# Patient Record
Sex: Female | Born: 1955
Health system: Southern US, Community
[De-identification: ages and names within clinical notes are randomized; demographics above are authoritative.]

## PROBLEM LIST (undated history)

## (undated) DIAGNOSIS — E785 Hyperlipidemia, unspecified: Secondary | ICD-10-CM

## (undated) DIAGNOSIS — I499 Cardiac arrhythmia, unspecified: Secondary | ICD-10-CM

## (undated) DIAGNOSIS — G43909 Migraine, unspecified, not intractable, without status migrainosus: Secondary | ICD-10-CM

## (undated) DIAGNOSIS — M25552 Pain in left hip: Secondary | ICD-10-CM

## (undated) DIAGNOSIS — R002 Palpitations: Secondary | ICD-10-CM

## (undated) DIAGNOSIS — I4891 Unspecified atrial fibrillation: Secondary | ICD-10-CM

## (undated) HISTORY — DX: Pain in left hip: M25.552

## (undated) HISTORY — DX: Migraine, unspecified, not intractable, without status migrainosus: G43.909

## (undated) HISTORY — DX: Palpitations: R00.2

---
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
## 1997-12-01 ENCOUNTER — Other Ambulatory Visit: Admission: RE | Admit: 1997-12-01 | Discharge: 1997-12-01 | Payer: Self-pay | Admitting: Obstetrics and Gynecology

## 1998-02-23 ENCOUNTER — Ambulatory Visit (HOSPITAL_COMMUNITY): Admission: RE | Admit: 1998-02-23 | Discharge: 1998-02-23 | Payer: Self-pay | Admitting: *Deleted

## 1999-04-14 ENCOUNTER — Other Ambulatory Visit: Admission: RE | Admit: 1999-04-14 | Discharge: 1999-04-14 | Payer: Self-pay | Admitting: Obstetrics and Gynecology

## 1999-04-25 ENCOUNTER — Ambulatory Visit (HOSPITAL_COMMUNITY): Admission: RE | Admit: 1999-04-25 | Discharge: 1999-04-25 | Payer: Self-pay | Admitting: Obstetrics and Gynecology

## 2000-03-04 ENCOUNTER — Ambulatory Visit (HOSPITAL_COMMUNITY): Admission: RE | Admit: 2000-03-04 | Discharge: 2000-03-04 | Payer: Self-pay | Admitting: Obstetrics and Gynecology

## 2000-03-19 HISTORY — PX: BACK SURGERY: SHX140

## 2000-11-08 ENCOUNTER — Ambulatory Visit (HOSPITAL_COMMUNITY): Admission: RE | Admit: 2000-11-08 | Discharge: 2000-11-08 | Payer: Self-pay | Admitting: Neurosurgery

## 2000-11-08 ENCOUNTER — Encounter: Payer: Self-pay | Admitting: Neurosurgery

## 2000-12-05 ENCOUNTER — Ambulatory Visit (HOSPITAL_COMMUNITY): Admission: RE | Admit: 2000-12-05 | Discharge: 2000-12-05 | Payer: Self-pay | Admitting: Neurosurgery

## 2000-12-05 ENCOUNTER — Encounter: Payer: Self-pay | Admitting: Neurosurgery

## 2002-08-27 ENCOUNTER — Other Ambulatory Visit: Admission: RE | Admit: 2002-08-27 | Discharge: 2002-08-27 | Payer: Self-pay | Admitting: Obstetrics and Gynecology

## 2003-11-02 ENCOUNTER — Other Ambulatory Visit: Admission: RE | Admit: 2003-11-02 | Discharge: 2003-11-02 | Payer: Self-pay | Admitting: Obstetrics and Gynecology

## 2004-11-02 ENCOUNTER — Ambulatory Visit: Payer: Self-pay | Admitting: Family Medicine

## 2004-11-06 ENCOUNTER — Other Ambulatory Visit: Admission: RE | Admit: 2004-11-06 | Discharge: 2004-11-06 | Payer: Self-pay | Admitting: Obstetrics and Gynecology

## 2005-03-19 HISTORY — PX: FOOT SURGERY: SHX648

## 2012-03-19 DIAGNOSIS — E785 Hyperlipidemia, unspecified: Secondary | ICD-10-CM | POA: Insufficient documentation

## 2012-03-19 HISTORY — DX: Hyperlipidemia, unspecified: E78.5

## 2016-03-01 DIAGNOSIS — J018 Other acute sinusitis: Secondary | ICD-10-CM | POA: Diagnosis not present

## 2016-05-16 DIAGNOSIS — Z8249 Family history of ischemic heart disease and other diseases of the circulatory system: Secondary | ICD-10-CM | POA: Diagnosis not present

## 2016-05-16 DIAGNOSIS — I208 Other forms of angina pectoris: Secondary | ICD-10-CM | POA: Diagnosis not present

## 2016-05-16 DIAGNOSIS — Z79899 Other long term (current) drug therapy: Secondary | ICD-10-CM | POA: Diagnosis not present

## 2016-05-16 DIAGNOSIS — Z7982 Long term (current) use of aspirin: Secondary | ICD-10-CM | POA: Diagnosis not present

## 2016-05-16 DIAGNOSIS — I499 Cardiac arrhythmia, unspecified: Secondary | ICD-10-CM | POA: Diagnosis not present

## 2016-05-16 DIAGNOSIS — I480 Paroxysmal atrial fibrillation: Secondary | ICD-10-CM | POA: Diagnosis not present

## 2016-05-16 DIAGNOSIS — I483 Typical atrial flutter: Secondary | ICD-10-CM | POA: Diagnosis not present

## 2016-05-16 DIAGNOSIS — R079 Chest pain, unspecified: Secondary | ICD-10-CM | POA: Diagnosis not present

## 2016-05-16 DIAGNOSIS — E785 Hyperlipidemia, unspecified: Secondary | ICD-10-CM | POA: Diagnosis not present

## 2016-05-16 DIAGNOSIS — I481 Persistent atrial fibrillation: Secondary | ICD-10-CM | POA: Diagnosis not present

## 2016-05-16 DIAGNOSIS — R001 Bradycardia, unspecified: Secondary | ICD-10-CM | POA: Diagnosis not present

## 2016-05-16 DIAGNOSIS — E870 Hyperosmolality and hypernatremia: Secondary | ICD-10-CM | POA: Diagnosis not present

## 2016-05-16 DIAGNOSIS — E784 Other hyperlipidemia: Secondary | ICD-10-CM | POA: Diagnosis not present

## 2016-05-16 DIAGNOSIS — Z880 Allergy status to penicillin: Secondary | ICD-10-CM | POA: Diagnosis not present

## 2016-05-16 DIAGNOSIS — R0602 Shortness of breath: Secondary | ICD-10-CM | POA: Diagnosis not present

## 2016-05-16 DIAGNOSIS — I4891 Unspecified atrial fibrillation: Secondary | ICD-10-CM | POA: Diagnosis not present

## 2016-05-16 DIAGNOSIS — Z0001 Encounter for general adult medical examination with abnormal findings: Secondary | ICD-10-CM | POA: Diagnosis not present

## 2016-05-16 DIAGNOSIS — I361 Nonrheumatic tricuspid (valve) insufficiency: Secondary | ICD-10-CM | POA: Diagnosis not present

## 2016-05-17 DIAGNOSIS — E870 Hyperosmolality and hypernatremia: Secondary | ICD-10-CM | POA: Diagnosis not present

## 2016-05-17 DIAGNOSIS — I480 Paroxysmal atrial fibrillation: Secondary | ICD-10-CM | POA: Diagnosis not present

## 2016-05-17 DIAGNOSIS — I4891 Unspecified atrial fibrillation: Secondary | ICD-10-CM

## 2016-05-17 HISTORY — DX: Unspecified atrial fibrillation: I48.91

## 2016-05-18 DIAGNOSIS — R079 Chest pain, unspecified: Secondary | ICD-10-CM | POA: Diagnosis not present

## 2016-05-18 DIAGNOSIS — Z0001 Encounter for general adult medical examination with abnormal findings: Secondary | ICD-10-CM | POA: Diagnosis not present

## 2016-05-18 DIAGNOSIS — I4891 Unspecified atrial fibrillation: Secondary | ICD-10-CM | POA: Diagnosis not present

## 2016-05-18 DIAGNOSIS — R001 Bradycardia, unspecified: Secondary | ICD-10-CM | POA: Diagnosis not present

## 2016-05-18 DIAGNOSIS — E870 Hyperosmolality and hypernatremia: Secondary | ICD-10-CM | POA: Diagnosis not present

## 2016-05-18 DIAGNOSIS — E784 Other hyperlipidemia: Secondary | ICD-10-CM | POA: Diagnosis not present

## 2016-05-18 DIAGNOSIS — I480 Paroxysmal atrial fibrillation: Secondary | ICD-10-CM | POA: Diagnosis not present

## 2016-05-22 DIAGNOSIS — R002 Palpitations: Secondary | ICD-10-CM | POA: Diagnosis not present

## 2016-05-22 DIAGNOSIS — R9431 Abnormal electrocardiogram [ECG] [EKG]: Secondary | ICD-10-CM | POA: Diagnosis not present

## 2016-05-22 DIAGNOSIS — Z8249 Family history of ischemic heart disease and other diseases of the circulatory system: Secondary | ICD-10-CM | POA: Diagnosis not present

## 2016-05-22 DIAGNOSIS — I4891 Unspecified atrial fibrillation: Secondary | ICD-10-CM | POA: Diagnosis not present

## 2016-05-28 DIAGNOSIS — R251 Tremor, unspecified: Secondary | ICD-10-CM | POA: Diagnosis not present

## 2016-05-28 DIAGNOSIS — R002 Palpitations: Secondary | ICD-10-CM | POA: Diagnosis not present

## 2016-05-28 DIAGNOSIS — I480 Paroxysmal atrial fibrillation: Secondary | ICD-10-CM | POA: Diagnosis not present

## 2016-05-28 DIAGNOSIS — R9431 Abnormal electrocardiogram [ECG] [EKG]: Secondary | ICD-10-CM | POA: Diagnosis not present

## 2016-05-28 DIAGNOSIS — R450 Nervousness: Secondary | ICD-10-CM | POA: Diagnosis not present

## 2016-05-28 DIAGNOSIS — Z8249 Family history of ischemic heart disease and other diseases of the circulatory system: Secondary | ICD-10-CM | POA: Diagnosis not present

## 2016-05-28 DIAGNOSIS — I4891 Unspecified atrial fibrillation: Secondary | ICD-10-CM | POA: Diagnosis not present

## 2016-07-03 DIAGNOSIS — Z23 Encounter for immunization: Secondary | ICD-10-CM | POA: Diagnosis not present

## 2016-07-04 DIAGNOSIS — Z23 Encounter for immunization: Secondary | ICD-10-CM | POA: Diagnosis not present

## 2016-07-11 DIAGNOSIS — I480 Paroxysmal atrial fibrillation: Secondary | ICD-10-CM | POA: Diagnosis not present

## 2016-07-19 DIAGNOSIS — I480 Paroxysmal atrial fibrillation: Secondary | ICD-10-CM | POA: Diagnosis not present

## 2016-07-19 DIAGNOSIS — Z79899 Other long term (current) drug therapy: Secondary | ICD-10-CM | POA: Diagnosis not present

## 2016-07-19 DIAGNOSIS — R002 Palpitations: Secondary | ICD-10-CM | POA: Diagnosis not present

## 2016-07-19 DIAGNOSIS — Z7982 Long term (current) use of aspirin: Secondary | ICD-10-CM | POA: Diagnosis not present

## 2016-07-19 DIAGNOSIS — I10 Essential (primary) hypertension: Secondary | ICD-10-CM | POA: Diagnosis not present

## 2016-07-19 DIAGNOSIS — R0602 Shortness of breath: Secondary | ICD-10-CM | POA: Diagnosis not present

## 2016-07-19 DIAGNOSIS — R000 Tachycardia, unspecified: Secondary | ICD-10-CM | POA: Diagnosis not present

## 2016-08-02 DIAGNOSIS — I480 Paroxysmal atrial fibrillation: Secondary | ICD-10-CM | POA: Diagnosis not present

## 2016-08-16 DIAGNOSIS — I480 Paroxysmal atrial fibrillation: Secondary | ICD-10-CM | POA: Diagnosis not present

## 2016-08-21 DIAGNOSIS — D485 Neoplasm of uncertain behavior of skin: Secondary | ICD-10-CM | POA: Diagnosis not present

## 2016-08-22 DIAGNOSIS — I480 Paroxysmal atrial fibrillation: Secondary | ICD-10-CM | POA: Diagnosis not present

## 2016-08-29 DIAGNOSIS — G43909 Migraine, unspecified, not intractable, without status migrainosus: Secondary | ICD-10-CM | POA: Insufficient documentation

## 2016-08-29 DIAGNOSIS — Z7901 Long term (current) use of anticoagulants: Secondary | ICD-10-CM

## 2016-08-29 DIAGNOSIS — I4891 Unspecified atrial fibrillation: Secondary | ICD-10-CM | POA: Insufficient documentation

## 2016-08-29 HISTORY — DX: Migraine, unspecified, not intractable, without status migrainosus: G43.909

## 2016-08-29 HISTORY — DX: Long term (current) use of anticoagulants: Z79.01

## 2016-09-04 DIAGNOSIS — Z7901 Long term (current) use of anticoagulants: Secondary | ICD-10-CM | POA: Diagnosis not present

## 2016-09-04 DIAGNOSIS — I471 Supraventricular tachycardia: Secondary | ICD-10-CM | POA: Diagnosis not present

## 2016-09-04 DIAGNOSIS — I480 Paroxysmal atrial fibrillation: Secondary | ICD-10-CM | POA: Diagnosis not present

## 2016-09-04 DIAGNOSIS — R001 Bradycardia, unspecified: Secondary | ICD-10-CM | POA: Insufficient documentation

## 2016-09-04 HISTORY — DX: Bradycardia, unspecified: R00.1

## 2016-10-04 DIAGNOSIS — Z6827 Body mass index (BMI) 27.0-27.9, adult: Secondary | ICD-10-CM | POA: Diagnosis not present

## 2016-10-04 DIAGNOSIS — Z0000 Encounter for general adult medical examination without abnormal findings: Secondary | ICD-10-CM | POA: Diagnosis not present

## 2016-10-05 DIAGNOSIS — E782 Mixed hyperlipidemia: Secondary | ICD-10-CM | POA: Diagnosis not present

## 2016-10-05 DIAGNOSIS — Z0000 Encounter for general adult medical examination without abnormal findings: Secondary | ICD-10-CM | POA: Diagnosis not present

## 2016-10-05 DIAGNOSIS — I480 Paroxysmal atrial fibrillation: Secondary | ICD-10-CM | POA: Diagnosis not present

## 2016-10-16 DIAGNOSIS — I6523 Occlusion and stenosis of bilateral carotid arteries: Secondary | ICD-10-CM | POA: Diagnosis not present

## 2016-10-16 DIAGNOSIS — Z136 Encounter for screening for cardiovascular disorders: Secondary | ICD-10-CM | POA: Diagnosis not present

## 2016-10-16 DIAGNOSIS — H8149 Vertigo of central origin, unspecified ear: Secondary | ICD-10-CM | POA: Diagnosis not present

## 2016-10-16 DIAGNOSIS — R42 Dizziness and giddiness: Secondary | ICD-10-CM | POA: Diagnosis not present

## 2016-10-16 DIAGNOSIS — Z8489 Family history of other specified conditions: Secondary | ICD-10-CM | POA: Diagnosis not present

## 2016-10-16 DIAGNOSIS — I482 Chronic atrial fibrillation: Secondary | ICD-10-CM | POA: Diagnosis not present

## 2016-10-16 DIAGNOSIS — I639 Cerebral infarction, unspecified: Secondary | ICD-10-CM | POA: Diagnosis not present

## 2016-10-16 DIAGNOSIS — I714 Abdominal aortic aneurysm, without rupture: Secondary | ICD-10-CM | POA: Diagnosis not present

## 2016-12-11 DIAGNOSIS — I471 Supraventricular tachycardia: Secondary | ICD-10-CM | POA: Diagnosis not present

## 2016-12-11 DIAGNOSIS — I480 Paroxysmal atrial fibrillation: Secondary | ICD-10-CM | POA: Diagnosis not present

## 2016-12-11 DIAGNOSIS — F419 Anxiety disorder, unspecified: Secondary | ICD-10-CM

## 2016-12-11 DIAGNOSIS — R001 Bradycardia, unspecified: Secondary | ICD-10-CM | POA: Diagnosis not present

## 2016-12-11 DIAGNOSIS — Z7901 Long term (current) use of anticoagulants: Secondary | ICD-10-CM | POA: Diagnosis not present

## 2016-12-11 HISTORY — DX: Anxiety disorder, unspecified: F41.9

## 2016-12-25 DIAGNOSIS — Z01419 Encounter for gynecological examination (general) (routine) without abnormal findings: Secondary | ICD-10-CM | POA: Diagnosis not present

## 2016-12-25 DIAGNOSIS — Z6827 Body mass index (BMI) 27.0-27.9, adult: Secondary | ICD-10-CM | POA: Diagnosis not present

## 2016-12-25 DIAGNOSIS — Z124 Encounter for screening for malignant neoplasm of cervix: Secondary | ICD-10-CM | POA: Diagnosis not present

## 2016-12-25 DIAGNOSIS — Z1231 Encounter for screening mammogram for malignant neoplasm of breast: Secondary | ICD-10-CM | POA: Diagnosis not present

## 2017-01-01 DIAGNOSIS — L989 Disorder of the skin and subcutaneous tissue, unspecified: Secondary | ICD-10-CM | POA: Diagnosis not present

## 2017-01-03 DIAGNOSIS — L989 Disorder of the skin and subcutaneous tissue, unspecified: Secondary | ICD-10-CM | POA: Diagnosis not present

## 2017-01-03 DIAGNOSIS — D225 Melanocytic nevi of trunk: Secondary | ICD-10-CM | POA: Diagnosis not present

## 2017-03-08 DIAGNOSIS — Z23 Encounter for immunization: Secondary | ICD-10-CM | POA: Diagnosis not present

## 2017-04-09 DIAGNOSIS — H5213 Myopia, bilateral: Secondary | ICD-10-CM | POA: Diagnosis not present

## 2017-07-19 ENCOUNTER — Ambulatory Visit: Payer: BLUE CROSS/BLUE SHIELD | Admitting: Sports Medicine

## 2017-07-19 ENCOUNTER — Encounter: Payer: Self-pay | Admitting: Sports Medicine

## 2017-07-19 DIAGNOSIS — L603 Nail dystrophy: Secondary | ICD-10-CM | POA: Diagnosis not present

## 2017-07-19 DIAGNOSIS — B351 Tinea unguium: Secondary | ICD-10-CM | POA: Diagnosis not present

## 2017-07-19 DIAGNOSIS — M67471 Ganglion, right ankle and foot: Secondary | ICD-10-CM

## 2017-07-19 DIAGNOSIS — L608 Other nail disorders: Secondary | ICD-10-CM | POA: Diagnosis not present

## 2017-07-19 NOTE — Progress Notes (Signed)
Subjective: Madeline Mcmillan is a 62 y.o. female patient seen today in office with complaint of mildly painful thickened and discolored first toenail.  Reports that she has given time with a dry blood to grow out and has not improved.  Patient denies any known trauma or injury to the toes states that she does notice that back in February is when she noticed the bleeding happening.  Does admit to a history of A. fib and was started on a full dose of aspirin and that is when she started having issues with her nails having blood underneath them and having thick debris underneath him. Reports that nails are becoming difficult to manage because of the thickness. Patient also is concerned about a cyst that she has on the side of her right foot states that she has had it aspirated before in the past several years ago and states that sometimes it becomes swollen and also painful and admits that the pain is a dull achy type of pain over the area and wants to discuss if anything needs to be done about this before getting x-rays at today's visit.  Patient has no other pedal complaints at this time.   Review of Systems  All other systems reviewed and are negative.  There are no active problems to display for this patient.   Current Outpatient Medications on File Prior to Visit  Medication Sig Dispense Refill  . aspirin 325 MG tablet Take 325 mg by mouth daily.    . calcium carbonate (OSCAL) 1500 (600 Ca) MG TABS tablet Take by mouth 2 (two) times daily with a meal.    . flecainide (TAMBOCOR) 100 MG tablet Take 100 mg by mouth 2 (two) times daily.    . Omega-3 Fatty Acids (FISH OIL) 1200 MG CAPS Take by mouth.     No current facility-administered medications on file prior to visit.     Allergies  Allergen Reactions  . Penicillins Rash    Objective: Physical Exam  General: Well developed, nourished, no acute distress, awake, alert and oriented x 3  Vascular: Dorsalis pedis artery 2/4 bilateral,  Posterior tibial artery 2/4 bilateral, skin temperature warm to warm proximal to distal bilateral lower extremities, no varicosities, pedal hair present bilateral.  Neurological: Gross sensation present via light touch bilateral.   Dermatological: Skin is warm, dry, and supple bilateral, bilateral hallux nails are thick, and discolored with mild subungal debris and dry heme, no webspace macerations present bilateral, no open lesions present bilateral, no callus/corns/hyperkeratotic tissue present bilateral.  There is a focal raised soft tissue mass noted at the lateral aspect of the right foot at the fifth metatarsal base that is freely movable likely consistent with ganglion cyst.  No signs of infection bilateral.  Musculoskeletal: Hallux extensus noted bilateral. Muscular strength within normal limits without painon range of motion. No pain with calf compression bilateral.  Assessment and Plan:  Problem List Items Addressed This Visit    None    Visit Diagnoses    Nail dystrophy    -  Primary   Nail hemorrhage       Ganglion cyst of right foot         -Examined patient -Discussed treatment options for painful dystrophic nails and for ganglion cyst on right foot -Patient would like to think about treatment options for cyst which I discussed today conservative care of offloading and padding, an office procedure of aspiration, or getting x-ray done followed by MRI for surgical planning of  complete excision.  Patient states that she would like to think about these options and let me know next visit what she prefers. -At great toenails fungal culture was obtained by removing a portion of the hard nail itself from each of the involved toenails using a sterile nail nipper and sent to [facility] lab. Patient tolerated the biopsy procedure well without discomfort or need for anesthesia.  -Patient to return in 4 weeks for follow up evaluation and discussion of fungal culture results or sooner if symptoms  worsen.  Asencion Islam, DPM

## 2017-08-05 ENCOUNTER — Telehealth: Payer: Self-pay | Admitting: Sports Medicine

## 2017-08-05 NOTE — Telephone Encounter (Signed)
Pt. Was calling to check on her results from her previous labs.

## 2017-08-06 NOTE — Telephone Encounter (Signed)
Will you let patient know that her fungal culture results were reviewed and show her nails are thickened and discolored due to microtrauma. There is NO fungus, yeast, or mold in nails. Changes to her nails are from pressure from shoes or possibly from stubbing the toe(s). She can try home treatments of filing her nails daily and applying tea tree oil to nails and then vinegar soaks (1 cup of white distilled vinegar to 8 cups of warm water) soak for 20 mins once weekly. Patient should also avoid shoes that crowd her toes. It will take time and consistency for 1 year to appreciate any difference with these home treatments. -Dr. Marylene Land

## 2017-08-06 NOTE — Telephone Encounter (Signed)
Left message requesting callback to discuss results and recommendations.

## 2017-08-06 NOTE — Telephone Encounter (Signed)
I informed pt of Dr. Stover's review of results and orders. Pt states understanding. 

## 2017-11-20 ENCOUNTER — Telehealth: Payer: Self-pay | Admitting: Sports Medicine

## 2017-11-20 NOTE — Telephone Encounter (Signed)
Pt states she has redness and irritation around the nails and something is not right. I told pt she had been using the tea tree oil for a long time and she may be having a reaction or in some case an infection, to stop the tea tree oil, and begin 1/4 cup epsom salt to 1 quart warm water daily until seen in office for reevaluation. Pt states she will call to schedule.

## 2017-11-20 NOTE — Telephone Encounter (Signed)
I saw Dr. Marylene Land in May and I have been doing some treatment with tee tree oil and foot soaking. I'm having some issues with that and I wanted to speak to the nurse to see what I might need to change or do differently. I can be reached at [phone]. Thank you.

## 2017-12-12 DIAGNOSIS — I471 Supraventricular tachycardia: Secondary | ICD-10-CM | POA: Diagnosis not present

## 2017-12-12 DIAGNOSIS — Z7901 Long term (current) use of anticoagulants: Secondary | ICD-10-CM | POA: Diagnosis not present

## 2017-12-12 DIAGNOSIS — R001 Bradycardia, unspecified: Secondary | ICD-10-CM | POA: Diagnosis not present

## 2017-12-12 DIAGNOSIS — R5383 Other fatigue: Secondary | ICD-10-CM | POA: Insufficient documentation

## 2017-12-12 DIAGNOSIS — I480 Paroxysmal atrial fibrillation: Secondary | ICD-10-CM | POA: Diagnosis not present

## 2017-12-12 HISTORY — DX: Other fatigue: R53.83

## 2017-12-25 DIAGNOSIS — R001 Bradycardia, unspecified: Secondary | ICD-10-CM | POA: Diagnosis not present

## 2018-01-02 DIAGNOSIS — Z1231 Encounter for screening mammogram for malignant neoplasm of breast: Secondary | ICD-10-CM | POA: Diagnosis not present

## 2018-01-02 DIAGNOSIS — Z01419 Encounter for gynecological examination (general) (routine) without abnormal findings: Secondary | ICD-10-CM | POA: Diagnosis not present

## 2018-01-02 DIAGNOSIS — Z124 Encounter for screening for malignant neoplasm of cervix: Secondary | ICD-10-CM | POA: Diagnosis not present

## 2018-01-03 DIAGNOSIS — Z1329 Encounter for screening for other suspected endocrine disorder: Secondary | ICD-10-CM | POA: Diagnosis not present

## 2018-01-03 DIAGNOSIS — Z13220 Encounter for screening for lipoid disorders: Secondary | ICD-10-CM | POA: Diagnosis not present

## 2018-01-03 DIAGNOSIS — Z1389 Encounter for screening for other disorder: Secondary | ICD-10-CM | POA: Diagnosis not present

## 2018-01-03 DIAGNOSIS — Z131 Encounter for screening for diabetes mellitus: Secondary | ICD-10-CM | POA: Diagnosis not present

## 2018-01-03 DIAGNOSIS — R5383 Other fatigue: Secondary | ICD-10-CM | POA: Diagnosis not present

## 2018-02-04 DIAGNOSIS — Z23 Encounter for immunization: Secondary | ICD-10-CM | POA: Diagnosis not present

## 2018-02-04 DIAGNOSIS — E782 Mixed hyperlipidemia: Secondary | ICD-10-CM | POA: Diagnosis not present

## 2018-02-04 DIAGNOSIS — I480 Paroxysmal atrial fibrillation: Secondary | ICD-10-CM | POA: Diagnosis not present

## 2018-05-06 DIAGNOSIS — E782 Mixed hyperlipidemia: Secondary | ICD-10-CM | POA: Diagnosis not present

## 2018-05-08 DIAGNOSIS — I480 Paroxysmal atrial fibrillation: Secondary | ICD-10-CM | POA: Diagnosis not present

## 2018-05-08 DIAGNOSIS — E782 Mixed hyperlipidemia: Secondary | ICD-10-CM | POA: Diagnosis not present

## 2018-05-21 DIAGNOSIS — H5213 Myopia, bilateral: Secondary | ICD-10-CM | POA: Diagnosis not present

## 2018-09-01 DIAGNOSIS — M79652 Pain in left thigh: Secondary | ICD-10-CM | POA: Diagnosis not present

## 2018-09-16 DIAGNOSIS — I480 Paroxysmal atrial fibrillation: Secondary | ICD-10-CM | POA: Diagnosis not present

## 2018-09-16 DIAGNOSIS — R001 Bradycardia, unspecified: Secondary | ICD-10-CM | POA: Diagnosis not present

## 2018-10-28 ENCOUNTER — Encounter: Payer: Self-pay | Admitting: Cardiology

## 2018-10-28 ENCOUNTER — Other Ambulatory Visit: Payer: Self-pay

## 2018-10-28 ENCOUNTER — Encounter (INDEPENDENT_AMBULATORY_CARE_PROVIDER_SITE_OTHER): Payer: Self-pay

## 2018-10-28 ENCOUNTER — Ambulatory Visit: Payer: BC Managed Care – PPO | Admitting: Cardiology

## 2018-10-28 VITALS — BP 120/80 | HR 64 | Ht 63.0 in | Wt 150.0 lb

## 2018-10-28 DIAGNOSIS — I480 Paroxysmal atrial fibrillation: Secondary | ICD-10-CM | POA: Diagnosis not present

## 2018-10-28 MED ORDER — DILTIAZEM HCL 30 MG PO TABS: 30.0000 mg | ORAL_TABLET | Freq: Four times a day (QID) | ORAL | 2 refills | Status: DC | PRN

## 2018-10-28 NOTE — Progress Notes (Signed)
Electrophysiology Office Note   Date:  10/28/2018   ID:  AYSEL GILCHREST, DOB 12/26/1955, MRN [id number]  PCP:  Rochel Brome, MD  Cardiologist:   Primary Electrophysiologist:  Sharrieff Spratlin Meredith Leeds, MD    No chief complaint on file.    History of Present Illness: Madeline Mcmillan is a 63 y.o. female who is being seen today for the evaluation of atrial fibrillation at the request of Cox, Kirsten, MD. Presenting today for electrophysiology evaluation.  She has a history of hyperlipidemia, and atrial fibrillation.  In February 2018, she was walking felt shaky and short of breath.  Her symptoms persisted and she went to a local hospital where she was diagnosed with atrial fibrillation with rapid ventricular response.  She was placed on diltiazem and spontaneously converted to sinus rhythm.  She underwent stress testing which showed normal perfusion as well as an echo with normal LV systolic function and normal biatrial size.  She was started on flecainide.  She had side effects from flecainide and thus it was discontinued.  She had a subsequent episode of atrial fibrillation May 2018 where she had palpitations.  Metoprolol was started at the time.  She had side effects her metoprolol which was discontinued.  Today, she denies symptoms of palpitations, chest pain, shortness of breath, orthopnea, PND, lower extremity edema, claudication, dizziness, presyncope, syncope, bleeding, or neurologic sequela. The patient is tolerating medications without difficulties.  Today, she is complaining of twinges in her chest.  The twinges last a few seconds at a time.  There are no exacerbating or alleviating factors.  Some days she has multiple episodes, some days she does not have any.  She is able to do all her daily activities without restriction.  She continues to exercise.   Past Medical History:  Diagnosis Date  . Atrial fibrillation ([place]) 05/2016  . Hyperlipidemia 2014  . Migraine headache   . Pain in  left hip    POSTERIOR AND LATERAL   . Palpitations    Past Surgical History:  Procedure Laterality Date  . BACK SURGERY  2002  . [place]  . FOOT SURGERY  2007  . TONSILLECTOMY  1965     Current Outpatient Medications  Medication Sig Dispense Refill  . aspirin EC 81 MG tablet Take 162 mg by mouth daily.    . calcium carbonate (OSCAL) 1500 (600 Ca) MG TABS tablet Take by mouth 2 (two) times daily with a meal.    . flecainide (TAMBOCOR) 100 MG tablet Take 100 mg by mouth daily as needed.     . Omega-3 Fatty Acids (FISH OIL) 1200 MG CAPS Take by mouth.    . diltiazem (CARDIZEM) 30 MG tablet Take 1 tablet (30 mg total) by mouth 4 (four) times daily as needed (for elevated heart rates). 30 tablet 2   No current facility-administered medications for this visit.     Allergies:   Amoxicillin and Penicillins   Social History:  The patient  reports that she has never smoked. She has never used smokeless tobacco. She reports that she does not drink alcohol or use drugs.   Family History:  The patient's family history includes CAD in her mother; Cerebral aneurysm in her brother; Healthy in her sister; Other in her father.    ROS:  Please see the history of present illness.   Otherwise, review of systems is positive for none.   All other systems are reviewed and negative.  PHYSICAL EXAM: VS:  BP 120/80   Pulse 64   Ht 5\' 3"  (1.6 m)   Wt 150 lb (68 kg)   BMI 26.57 kg/m  , BMI Body mass index is 26.57 kg/m. GEN: Well nourished, well developed, in no acute distress  HEENT: normal  Neck: no JVD, carotid bruits, or masses Cardiac: RRR; no murmurs, rubs, or gallops,no edema  Respiratory:  clear to auscultation bilaterally, normal work of breathing GI: soft, nontender, nondistended, + BS MS: no deformity or atrophy  Skin: warm and dry Neuro:  Strength and sensation are intact Psych: euthymic mood, full affect  EKG:  EKG is ordered today. Personal review of the  ekg ordered shows sinus rhythm, rate 64  Recent Labs: No results found for requested labs within last 8760 hours.    Lipid Panel  No results found for: CHOL, TRIG, HDL, CHOLHDL, VLDL, LDLCALC, LDLDIRECT   Wt Readings from Last 3 Encounters:  10/28/18 150 lb (68 kg)      Other studies Reviewed: Additional studies/ records that were reviewed today include: ETT 12/25/17  Review of the above records today demonstrates:  BASELINE EKG NORMAL AT 74/MIN.  PT EXERCISED 7 MIN, BRUCE AT 8 METS, ABOVE AVERAGE FUNCTIONAL CAPACITY FOR AGE. ACHIEVED HR 136/MIN 86% MPHR. NO ANGINA OR ST ABNORMALITY RARE PAC/PVC.   ASSESSMENT AND PLAN:  1.  Paroxysmal atrial fibrillation: Has been diagnosed with atrial fibrillation in [place].  She says that she has had 2 episodes requiring hospitalization, but she is converted to sinus rhythm on her own.  We Lyrical Sowle work to get the records from [facility].  Fortunately she is in sinus rhythm.  She takes as needed flecainide, but has not needed it very recently at all.  I Ronica Vivian give her a prescription for diltiazem as needed to take with her flecainide.  She is having some twinges that are short-lived.  I do not think that these are cardiac in nature.  I Jerrion Tabbert see her back in 1 year for follow-up.  This patients CHA2DS2-VASc Score and unadjusted Ischemic Stroke Rate (% per year) is equal to 0.6 % stroke rate/year from a score of 1  Above score calculated as 1 point each if present [CHF, HTN, DM, Vascular=MI/PAD/Aortic Plaque, Age if 65-74, or Female] Above score calculated as 2 points each if present [Age > 75, or Stroke/TIA/TE]   Current medicines are reviewed at length with the patient today.   The patient does not have concerns regarding her medicines.  The following changes were made today: Diltiazem 30 mg as needed  Labs/ tests ordered today include:  Orders Placed This Encounter  Procedures  . EKG 12-Lead     Disposition:   FU with Allice Garro 1 year  Signed, Beauford Lando Jorja LoaMartin Sumaiyah Markert, MD  10/28/2018 10:34 AM     [facility] [address] [place] [address] [phone] (office) [phone] (fax)

## 2018-10-28 NOTE — Patient Instructions (Addendum)
Medication Instructions:  Your physician has recommended you make the following change in your medication:  1. TAKE Diltiazem 30 mg every 6 hours as needed for elevated heart rates/atrial fibrillation.  * If you need a refill on your cardiac medications before your next appointment, please call your pharmacy.   Labwork: None ordered  Testing/Procedures: None ordered  Follow-Up: Your physician wants you to follow-up in: 1 year with Dr. Curt Bears in [place].  You will receive a reminder letter in the mail two months in advance. If you don't receive a letter, please call our office to schedule the follow-up appointment.  Thank you for choosing [facility]!!   Trinidad Curet, RN [phone]

## 2018-12-18 DIAGNOSIS — E782 Mixed hyperlipidemia: Secondary | ICD-10-CM | POA: Diagnosis not present

## 2018-12-18 DIAGNOSIS — M25552 Pain in left hip: Secondary | ICD-10-CM | POA: Diagnosis not present

## 2018-12-18 DIAGNOSIS — I480 Paroxysmal atrial fibrillation: Secondary | ICD-10-CM | POA: Diagnosis not present

## 2018-12-19 DIAGNOSIS — M25552 Pain in left hip: Secondary | ICD-10-CM | POA: Diagnosis not present

## 2019-01-14 DIAGNOSIS — M5416 Radiculopathy, lumbar region: Secondary | ICD-10-CM

## 2019-01-14 HISTORY — DX: Radiculopathy, lumbar region: M54.16

## 2019-01-20 DIAGNOSIS — Z01419 Encounter for gynecological examination (general) (routine) without abnormal findings: Secondary | ICD-10-CM | POA: Diagnosis not present

## 2019-01-20 DIAGNOSIS — Z124 Encounter for screening for malignant neoplasm of cervix: Secondary | ICD-10-CM | POA: Diagnosis not present

## 2019-01-20 DIAGNOSIS — Z6825 Body mass index (BMI) 25.0-25.9, adult: Secondary | ICD-10-CM | POA: Diagnosis not present

## 2019-01-20 DIAGNOSIS — Z1231 Encounter for screening mammogram for malignant neoplasm of breast: Secondary | ICD-10-CM | POA: Diagnosis not present

## 2019-01-21 ENCOUNTER — Other Ambulatory Visit: Payer: Self-pay | Admitting: Neurosurgery

## 2019-01-21 DIAGNOSIS — M5416 Radiculopathy, lumbar region: Secondary | ICD-10-CM

## 2019-01-23 ENCOUNTER — Telehealth: Payer: Self-pay | Admitting: Cardiology

## 2019-01-23 NOTE — Telephone Encounter (Signed)
° °*  STAT* If patient is at the pharmacy, call can be transferred to refill team.   1. Which medications need to be refilled? (please list name of each medication and dose if known)  flecainide (TAMBOCOR) 100 MG tablet  2. Which pharmacy/location (including street and city if local pharmacy) is medication to be sent to? [place], [place]  3. Do they need a 30 day or 90 day supply? 66  Patient had an old rx from her previous doctor that has run out. She would like Dr. Curt Bears to write a new rx for her.

## 2019-01-26 ENCOUNTER — Other Ambulatory Visit: Payer: Self-pay

## 2019-01-26 NOTE — Telephone Encounter (Signed)
Ok to refill 

## 2019-01-26 NOTE — Telephone Encounter (Signed)
Ok to refill for 1 year.  Patient is due for follow up next August, so make sure pt has enough to cover till September 2021.  Thanks

## 2019-01-27 MED ORDER — FLECAINIDE ACETATE 100 MG PO TABS: 100.0000 mg | ORAL_TABLET | Freq: Every day | ORAL | 2 refills | Status: DC | PRN

## 2019-01-27 NOTE — Telephone Encounter (Signed)
Outpatient Medication Detail   Disp Refills Start End   flecainide (TAMBOCOR) 100 MG tablet 90 tablet 2 01/27/2019    Sig - Route: Take 1 tablet (100 mg total) by mouth daily as needed. - Oral   Sent to pharmacy as: flecainide (TAMBOCOR) 100 MG tablet   E-Prescribing Status: Sent to pharmacy (01/27/2019 9:56 AM EST)   Pharmacy  [place], [place]

## 2019-01-29 MED ORDER — FLECAINIDE ACETATE 100 MG PO TABS: ORAL_TABLET | ORAL | 2 refills | Status: DC

## 2019-02-16 ENCOUNTER — Other Ambulatory Visit: Payer: BC Managed Care – PPO

## 2019-02-19 DIAGNOSIS — Z1211 Encounter for screening for malignant neoplasm of colon: Secondary | ICD-10-CM | POA: Insufficient documentation

## 2019-02-19 DIAGNOSIS — K648 Other hemorrhoids: Secondary | ICD-10-CM | POA: Diagnosis not present

## 2019-02-19 HISTORY — DX: Encounter for screening for malignant neoplasm of colon: Z12.11

## 2019-02-27 ENCOUNTER — Ambulatory Visit
Admission: RE | Admit: 2019-02-27 | Discharge: 2019-02-27 | Disposition: A | Discharge status: Home / Self Care | Payer: BC Managed Care – PPO | Source: Ambulatory Visit | Attending: Neurosurgery | Admitting: Neurosurgery

## 2019-02-27 ENCOUNTER — Other Ambulatory Visit: Payer: Self-pay

## 2019-02-27 DIAGNOSIS — M5416 Radiculopathy, lumbar region: Secondary | ICD-10-CM

## 2019-02-27 DIAGNOSIS — M5127 Other intervertebral disc displacement, lumbosacral region: Secondary | ICD-10-CM | POA: Diagnosis not present

## 2019-02-27 MED ORDER — GADOBENATE DIMEGLUMINE 529 MG/ML IV SOLN
13.0000 mL | Freq: Once | INTRAVENOUS | Status: AC | PRN
  Administered 2019-02-27: 13 mL via INTRAVENOUS

## 2019-02-28 DIAGNOSIS — L719 Rosacea, unspecified: Secondary | ICD-10-CM | POA: Diagnosis not present

## 2019-02-28 DIAGNOSIS — L570 Actinic keratosis: Secondary | ICD-10-CM | POA: Diagnosis not present

## 2019-02-28 DIAGNOSIS — D485 Neoplasm of uncertain behavior of skin: Secondary | ICD-10-CM | POA: Diagnosis not present

## 2019-03-03 DIAGNOSIS — M5416 Radiculopathy, lumbar region: Secondary | ICD-10-CM | POA: Diagnosis not present

## 2019-03-03 DIAGNOSIS — Z6825 Body mass index (BMI) 25.0-25.9, adult: Secondary | ICD-10-CM | POA: Diagnosis not present

## 2019-03-03 DIAGNOSIS — M4310 Spondylolisthesis, site unspecified: Secondary | ICD-10-CM

## 2019-03-03 HISTORY — DX: Spondylolisthesis, site unspecified: M43.10

## 2019-03-11 DIAGNOSIS — M4310 Spondylolisthesis, site unspecified: Secondary | ICD-10-CM | POA: Diagnosis not present

## 2019-03-11 DIAGNOSIS — M545 Low back pain: Secondary | ICD-10-CM | POA: Diagnosis not present

## 2019-03-24 DIAGNOSIS — M48061 Spinal stenosis, lumbar region without neurogenic claudication: Secondary | ICD-10-CM

## 2019-03-24 DIAGNOSIS — M48062 Spinal stenosis, lumbar region with neurogenic claudication: Secondary | ICD-10-CM | POA: Diagnosis not present

## 2019-03-24 DIAGNOSIS — M5416 Radiculopathy, lumbar region: Secondary | ICD-10-CM | POA: Diagnosis not present

## 2019-03-24 DIAGNOSIS — Z6825 Body mass index (BMI) 25.0-25.9, adult: Secondary | ICD-10-CM | POA: Diagnosis not present

## 2019-03-24 HISTORY — DX: Spinal stenosis, lumbar region without neurogenic claudication: M48.061

## 2019-04-03 DIAGNOSIS — M5416 Radiculopathy, lumbar region: Secondary | ICD-10-CM | POA: Diagnosis not present

## 2019-04-03 DIAGNOSIS — M48062 Spinal stenosis, lumbar region with neurogenic claudication: Secondary | ICD-10-CM | POA: Diagnosis not present

## 2019-04-23 DIAGNOSIS — M4310 Spondylolisthesis, site unspecified: Secondary | ICD-10-CM | POA: Diagnosis not present

## 2019-04-23 DIAGNOSIS — Z6826 Body mass index (BMI) 26.0-26.9, adult: Secondary | ICD-10-CM | POA: Diagnosis not present

## 2019-04-28 DIAGNOSIS — Z8639 Personal history of other endocrine, nutritional and metabolic disease: Secondary | ICD-10-CM | POA: Diagnosis not present

## 2019-04-28 DIAGNOSIS — Z8679 Personal history of other diseases of the circulatory system: Secondary | ICD-10-CM | POA: Diagnosis not present

## 2019-04-28 DIAGNOSIS — Z0000 Encounter for general adult medical examination without abnormal findings: Secondary | ICD-10-CM | POA: Diagnosis not present

## 2019-04-28 DIAGNOSIS — Z131 Encounter for screening for diabetes mellitus: Secondary | ICD-10-CM | POA: Diagnosis not present

## 2019-04-28 DIAGNOSIS — E663 Overweight: Secondary | ICD-10-CM | POA: Diagnosis not present

## 2019-04-28 DIAGNOSIS — R0989 Other specified symptoms and signs involving the circulatory and respiratory systems: Secondary | ICD-10-CM | POA: Diagnosis not present

## 2019-04-28 DIAGNOSIS — Z136 Encounter for screening for cardiovascular disorders: Secondary | ICD-10-CM | POA: Diagnosis not present

## 2019-04-28 DIAGNOSIS — Z13220 Encounter for screening for lipoid disorders: Secondary | ICD-10-CM | POA: Diagnosis not present

## 2019-04-28 DIAGNOSIS — Z1331 Encounter for screening for depression: Secondary | ICD-10-CM | POA: Diagnosis not present

## 2019-04-29 ENCOUNTER — Telehealth: Payer: Self-pay | Admitting: *Deleted

## 2019-04-29 NOTE — Telephone Encounter (Signed)
   Primary Cardiologist: Will Jorja Loa, MD  Chart reviewed as part of pre-operative protocol coverage. Patient last seen by Dr. Elberta Fortis on 10/28/2018 at which time she was doing well from a cardiac standpoint. She did report occasional "twinges" in her chest that would last for a few seconds and then resolve independently. This was felt to be non-cardiac in nature. Patient contacted today in regards to peri-operative risk assessment and reported she has done well since last visit. She continues to have occasional "twinges" on the left side of her chest that occur randomly and only last for a few seconds a time. Non-exertional and not worsening or becoming more frequent. She has occasional brief episodes of palpitations but has only needed her PRN Flecainide and Diltiazem once since her last visit about 3-4 months ago. No shortness of breath, lightheadedness, dizziness, syncope, orthopnea, PND, or edema. She walks about 3 miles everyday without any anginal symptoms.   Given past medical history and time since last visit, based on ACC/AHA guidelines, Madeline Mcmillan would be at acceptable risk for the planned procedure without further cardiovascular testing.   I will route this recommendation to the requesting party via Epic fax function and remove from pre-op pool.  Please call with questions.  Corrin Parker, PA-C 04/29/2019, 4:33 PM

## 2019-04-29 NOTE — Telephone Encounter (Signed)
   [facility] Pre-operative Risk Assessment    Request for surgical clearance:  1. What type of surgery is being performed? L4-5 LUMBAR FUSION   2. When is this surgery scheduled? 05/11/19   3. What type of clearance is required (medical clearance vs. Pharmacy clearance to hold med vs. Both)? MEDICAL  4. Are there any medications that need to be held prior to surgery and how long? NONE LISTED  5. Practice name and name of physician performing surgery? [facility]; DR. Mallie Mussel POOL   6. What is your office phone number [phone]    7.   What is your office fax number [phone] ATTN: [id number]  8.   Anesthesia type (None, local, MAC, general) ? GENERAL   Julaine Hua 04/29/2019, 3:40 PM  _________________________________________________________________   (provider comments below)

## 2019-05-05 DIAGNOSIS — M4316 Spondylolisthesis, lumbar region: Secondary | ICD-10-CM | POA: Diagnosis not present

## 2019-05-05 DIAGNOSIS — Z01818 Encounter for other preprocedural examination: Secondary | ICD-10-CM | POA: Diagnosis not present

## 2019-05-14 ENCOUNTER — Emergency Department (HOSPITAL_COMMUNITY): Payer: BLUE CROSS/BLUE SHIELD

## 2019-05-14 ENCOUNTER — Other Ambulatory Visit: Payer: Self-pay | Admitting: Adult Health

## 2019-05-14 ENCOUNTER — Emergency Department (HOSPITAL_COMMUNITY)
Admission: EM | Admit: 2019-05-14 | Discharge: 2019-05-14 | Disposition: A | Discharge status: Home / Self Care | Payer: BLUE CROSS/BLUE SHIELD | Attending: Emergency Medicine | Admitting: Emergency Medicine

## 2019-05-14 ENCOUNTER — Other Ambulatory Visit: Payer: Self-pay

## 2019-05-14 DIAGNOSIS — R0602 Shortness of breath: Secondary | ICD-10-CM | POA: Diagnosis not present

## 2019-05-14 DIAGNOSIS — Z79899 Other long term (current) drug therapy: Secondary | ICD-10-CM | POA: Insufficient documentation

## 2019-05-14 DIAGNOSIS — J1282 Pneumonia due to coronavirus disease 2019: Secondary | ICD-10-CM | POA: Diagnosis not present

## 2019-05-14 DIAGNOSIS — U071 COVID-19: Secondary | ICD-10-CM | POA: Insufficient documentation

## 2019-05-14 DIAGNOSIS — Z6825 Body mass index (BMI) 25.0-25.9, adult: Secondary | ICD-10-CM | POA: Diagnosis not present

## 2019-05-14 DIAGNOSIS — J1289 Other viral pneumonia: Secondary | ICD-10-CM | POA: Diagnosis not present

## 2019-05-14 DIAGNOSIS — R509 Fever, unspecified: Secondary | ICD-10-CM | POA: Diagnosis not present

## 2019-05-14 DIAGNOSIS — R05 Cough: Secondary | ICD-10-CM | POA: Diagnosis not present

## 2019-05-14 LAB — BASIC METABOLIC PANEL
Anion gap: 10 (ref 5–15)
BUN: 12 mg/dL (ref 8–23)
CO2: 24 mmol/L (ref 22–32)
Calcium: 9 mg/dL (ref 8.9–10.3)
Chloride: 103 mmol/L (ref 98–111)
Creatinine, Ser: 0.66 mg/dL (ref 0.44–1.00)
GFR calc Af Amer: >60 mL/min (ref >60)
GFR calc non Af Amer: >60 mL/min (ref >60)
Glucose, Bld: 124 mg/dL — ABNORMAL HIGH (ref 70–99)
Potassium: 3.4 mmol/L — ABNORMAL LOW (ref 3.5–5.1)
Sodium: 137 mmol/L (ref 135–145)

## 2019-05-14 LAB — CBC WITH DIFFERENTIAL/PLATELET
Abs Immature Granulocytes: 0.04 10*3/uL (ref 0.00–0.07)
Basophils Absolute: 0 10*3/uL (ref 0.0–0.1)
Basophils Relative: 0 %
Eosinophils Absolute: 0 10*3/uL (ref 0.0–0.5)
Eosinophils Relative: 0 %
HCT: 42.5 % (ref 36.0–46.0)
Hemoglobin: 13.9 g/dL (ref 12.0–15.0)
Immature Granulocytes: 1 %
Lymphocytes Relative: 12 %
Lymphs Abs: 0.8 10*3/uL (ref 0.7–4.0)
MCH: 30 pg (ref 26.0–34.0)
MCHC: 32.7 g/dL (ref 30.0–36.0)
MCV: 91.8 fL (ref 80.0–100.0)
Monocytes Absolute: 0.6 10*3/uL (ref 0.1–1.0)
Monocytes Relative: 10 %
Neutro Abs: 5 10*3/uL (ref 1.7–7.7)
Neutrophils Relative %: 77 %
Platelets: 119 10*3/uL — ABNORMAL LOW (ref 150–400)
RBC: 4.63 MIL/uL (ref 3.87–5.11)
RDW: 12.8 % (ref 11.5–15.5)
WBC: 6.5 10*3/uL (ref 4.0–10.5)
nRBC: 0 % (ref 0.0–0.2)

## 2019-05-14 LAB — SARS CORONAVIRUS 2 (TAT 6-24 HRS): SARS Coronavirus 2: POSITIVE — AB

## 2019-05-14 MED ORDER — ALBUTEROL SULFATE HFA 108 (90 BASE) MCG/ACT IN AERS
4.0000 | INHALATION_SPRAY | Freq: Once | RESPIRATORY_TRACT | Status: AC
  Administered 2019-05-14: 15:00:00 4 via RESPIRATORY_TRACT
  Filled 2019-05-14: qty 6.7

## 2019-05-14 MED ORDER — ACETAMINOPHEN 500 MG PO TABS
1000.0000 mg | ORAL_TABLET | Freq: Once | ORAL | Status: AC
  Administered 2019-05-14: 1000 mg via ORAL
  Filled 2019-05-14: qty 2

## 2019-05-14 MED ORDER — PREDNISONE 20 MG PO TABS
60.0000 mg | ORAL_TABLET | Freq: Once | ORAL | Status: AC
  Administered 2019-05-14: 15:00:00 60 mg via ORAL
  Filled 2019-05-14: qty 3

## 2019-05-14 NOTE — ED Triage Notes (Addendum)
Per patient, she tested positive for covid 10 days ago, was feeling better, but last two days has had fever, went to pcp, pcp said to come to ED bec patient has pneumonia in both lungs. Patient denies pain, says she has shobr while ambulating. Patient o2 in triage 96 on RA, patient o2 while ambulating is 93% on RA

## 2019-05-14 NOTE — Progress Notes (Signed)
  I connected by phone with Madeline Mcmillan on 05/14/2019 at 6:27 PM to discuss the potential use of an new treatment for mild to moderate COVID-19 viral infection in non-hospitalized patients.  This patient is a 64 y.o. female that meets the FDA criteria for Emergency Use Authorization of bamlanivimab or casirivimab\imdevimab.  Has a (+) direct SARS-CoV-2 viral test result  Has mild or moderate COVID-19   Is ? 64 years of age and weighs ? 40 kg  Is NOT hospitalized due to COVID-19  Is NOT requiring oxygen therapy or requiring an increase in baseline oxygen flow rate due to COVID-19  Is within 10 days of symptom onset  Has at least one of the high risk factor(s) for progression to severe COVID-19 and/or hospitalization as defined in EUA.  Specific high risk criteria : Cardiovascular Disease   I have spoken and communicated the following to the patient or parent/caregiver:  1. FDA has authorized the emergency use of bamlanivimab and casirivimab\imdevimab for the treatment of mild to moderate COVID-19 in adults and pediatric patients with positive results of direct SARS-CoV-2 viral testing who are 103 years of age and older weighing at least 40 kg, and who are at high risk for progressing to severe COVID-19 and/or hospitalization.  2. The significant known and potential risks and benefits of bamlanivimab and casirivimab\imdevimab, and the extent to which such potential risks and benefits are unknown.  3. Information on available alternative treatments and the risks and benefits of those alternatives, including clinical trials.  4. Patients treated with bamlanivimab and casirivimab\imdevimab should continue to self-isolate and use infection control measures (e.g., wear mask, isolate, social distance, avoid sharing personal items, clean and disinfect "high touch" surfaces, and frequent handwashing) according to CDC guidelines.   5. The patient or parent/caregiver has the option to accept or  refuse bamlanivimab or casirivimab\imdevimab .  After reviewing this information with the patient, The patient agreed to proceed with receiving the bamlanimivab infusion and will be provided a copy of the Fact sheet prior to receiving the infusion.  Scheduled 05/15/19 at 1030 , will bring copy of test results.  Madeline Mcmillan Madeline Mcmillan 05/14/2019 6:27 PM

## 2019-05-14 NOTE — Discharge Instructions (Signed)
You have been called in the Covid hospital at home program, and they will reach out to you by phone  Use all the medications prescribed to you by your primary care doctor.  I recommend that you purchase a home pulse ox to help better monitor your oxygen at home, if you start to have increased work of breathing or shortness of breath or your oxygen drops below 90% please immediately return to the hospital for reevaluation.  If you develop persistent fevers, shortness of breath or difficulty breathing, chest pain, severe headache and neck pain, persistent nausea and vomiting or other new or concerning symptoms return to the Emergency department.

## 2019-05-14 NOTE — ED Provider Notes (Signed)
[facility] DEPT Provider Note   CSN: [id number] Arrival date & time: 05/14/19  1324     History Chief Complaint  Patient presents with  . covid pneumonia    Madeline Mcmillan is a 64 y.o. female.  Madeline Mcmillan is a 64 y.o. female with a history of hyperlipidemia, Afib and migraines, who presents with cough fever and shortness of breath.  Patient states that she tested positive for Covid 10 days ago during a preop testing appointment, but was asymptomatic at this time.  She states that she had no symptoms until yesterday when she started developing fever, chills, body aches, and cough.  She has also developed worsening shortness of breath over the past 2 days.  She went to see her PCP today and had a chest x-ray done that showed bilateral pneumonia, and was initially prescribed prednisone, albuterol and Levaquin, but her PCP then called her back and recommended that she come to the ED to get checked out.  She denies any associated chest pain, no nausea, vomiting or diarrhea.  States she is not used any of the medications prescribed by her PCP yet.  No other aggravating or alleviating factors.        Past Medical History:  Diagnosis Date  . Atrial fibrillation ([place]) 05/2016  . Hyperlipidemia 2014  . Migraine headache   . Pain in left hip    POSTERIOR AND LATERAL   . Palpitations     There are no problems to display for this patient.   Past Surgical History:  Procedure Laterality Date  . BACK SURGERY  2002  . [place]  . FOOT SURGERY  2007  . TONSILLECTOMY  1965     OB History   No obstetric history on file.     Family History  Problem Relation Age of Onset  . CAD Mother   . Other Father        BACK SURGERY, HIP REPLACEMENT  . Healthy Sister   . Cerebral aneurysm Brother     Social History   Tobacco Use  . Smoking status: Never Smoker  . Smokeless tobacco: Never Used  Substance Use Topics  . Alcohol  use: Never  . Drug use: Never    Home Medications Prior to Admission medications   Medication Sig Start Date End Date Taking? Authorizing Provider  atorvastatin (LIPITOR) 10 MG tablet Take 10 mg by mouth daily. 03/18/19  Yes [provider]  calcium carbonate (OSCAL) 1500 (600 Ca) MG TABS tablet Take by mouth 2 (two) times daily with a meal.   Yes [provider]  diltiazem (CARDIZEM) 30 MG tablet Take 1 tablet (30 mg total) by mouth 4 (four) times daily as needed (for elevated heart rates). 10/28/18  Yes Camnitz, Ocie Doyne, MD  flecainide [facility]) 100 MG tablet May take up to 300 mg of Flecainide daily as needed for Atrial fibrillation/palpitations. Patient taking differently: Take 300 mg by mouth as needed (r Atrial fibrillation/palpitations.). May take up to 300 mg of Flecainide daily as needed for Atrial fibrillation/palpitations. 01/29/19  Yes Camnitz, Will Hassell Done, MD  Omega-3 Fatty Acids (FISH OIL) 1200 MG CAPS Take 1,200 mg by mouth daily.    Yes [provider]  albuterol (VENTOLIN HFA) 108 (90 Base) MCG/ACT inhaler Inhale 1 puff into the lungs every 6 (six) hours as needed. 05/14/19   [provider]  levofloxacin (LEVAQUIN) 750 MG tablet Take 750 mg by mouth daily. 05/14/19  [provider]  predniSONE (DELTASONE) 20 MG tablet Take 20 mg by mouth 2 (two) times daily. 05/14/19   [provider]  VIRTUSSIN A/C 100-10 MG/5ML syrup Take 10 mLs by mouth every 8 (eight) hours as needed. 05/11/19   [provider]    Allergies    Amoxicillin and Penicillins  Review of Systems   Review of Systems  Constitutional: Positive for chills and fever.  HENT: Negative for congestion, rhinorrhea and sore throat.   Respiratory: Positive for cough and shortness of breath.   Cardiovascular: Negative for chest pain.  Gastrointestinal: Negative for abdominal pain, diarrhea, nausea and vomiting.  Genitourinary: Negative for dysuria and  frequency.  Musculoskeletal: Positive for myalgias. Negative for arthralgias and back pain.  Skin: Negative for color change and rash.  Neurological: Negative for dizziness, syncope and light-headedness.    Physical Exam Updated Vital Signs BP 115/77 (BP Location: Left Arm)   Pulse (!) 101   Temp 99.8 F (37.7 C) (Oral)   Resp 18   Ht 5\' 3"  (1.6 m)   Wt 65.8 kg   SpO2 96%   BMI 25.69 kg/m   Physical Exam Vitals and nursing note reviewed.  Constitutional:      General: She is not in acute distress.    Appearance: Normal appearance. She is well-developed. She is not ill-appearing or diaphoretic.  HENT:     Head: Normocephalic and atraumatic.     Mouth/Throat:     Mouth: Mucous membranes are moist.     Pharynx: Oropharynx is clear.  Eyes:     General:        Right eye: No discharge.        Left eye: No discharge.  Cardiovascular:     Rate and Rhythm: Normal rate and regular rhythm.     Heart sounds: Normal heart sounds.  Pulmonary:     Effort: Pulmonary effort is normal. No respiratory distress.     Breath sounds: Rhonchi present. No wheezing.     Comments: Respirations equal and unlabored, patient able to speak in full sentences, O2 sats 93-97% on room air, lungs with some scattered faint crackles and rhonchi  Abdominal:     General: Bowel sounds are normal. There is no distension.     Palpations: Abdomen is soft. There is no mass.     Tenderness: There is no abdominal tenderness. There is no guarding.     Comments: Abdomen soft, nondistended, nontender to palpation in all quadrants without guarding or peritoneal signs  Musculoskeletal:        General: No deformity.     Cervical back: Neck supple.  Skin:    General: Skin is warm and dry.     Capillary Refill: Capillary refill takes less than 2 seconds.  Neurological:     Mental Status: She is alert.     Coordination: Coordination normal.     Comments: Speech is clear, able to follow commands CN III-XII  intact Normal strength in upper and lower extremities bilaterally including dorsiflexion and plantar flexion, strong and equal grip strength Sensation normal to light and sharp touch Moves extremities without ataxia, coordination intact      ED Results / Procedures / Treatments   Labs (all labs ordered are listed, but only abnormal results are displayed) Labs Reviewed  CBC WITH DIFFERENTIAL/PLATELET - Abnormal; Notable for the following components:      Result Value   Platelets 119 (*)    All other components within normal limits  BASIC METABOLIC PANEL - Abnormal; Notable for the following components:   Potassium 3.4 (*)    Glucose, Bld 124 (*)    All other components within normal limits  SARS CORONAVIRUS 2 (TAT 6-24 HRS)    EKG None  Radiology DG Chest Port 1 View  Result Date: 05/14/2019 CLINICAL DATA:  COVID pneumonia, was feeling better but now has fever and shortness of breath EXAM: PORTABLE CHEST 1 VIEW COMPARISON:  Portable exam 1347 hours compared to 07/19/2016 FINDINGS: Upper normal heart size. Mediastinal contours and pulmonary vascularity normal. Mild opacities at the lung bases question multifocal pneumonia versus atelectasis. Upper lungs clear. No pleural effusion or pneumothorax. Bones unremarkable. IMPRESSION: Mild bibasilar opacities question multifocal pneumonia versus atelectasis. Electronically Signed   By: Ulyses Southward M.D.   On: 05/14/2019 14:28    Procedures Procedures (including critical care time)  Medications Ordered in ED Medications  albuterol (VENTOLIN HFA) 108 (90 Base) MCG/ACT inhaler 4 puff (4 puffs Inhalation Given 05/14/19 1455)  predniSONE (DELTASONE) tablet 60 mg (60 mg Oral Given 05/14/19 1455)  acetaminophen (TYLENOL) tablet 1,000 mg (1,000 mg Oral Given 05/14/19 1455)    ED Course  I have reviewed the triage vital signs and the nursing notes.  Pertinent labs & imaging results that were available during my care of the patient were  reviewed by me and considered in my medical decision making (see chart for details).    MDM Rules/Calculators/A&P                64 yo F who tested positive for COVID 10 days ago, developed symptoms yesterday, including fevers, fatigue, cough and sob. Patient with normal O2 sats on room air. CXR with mild bilateral infiltrates. Patient maintains O2 sats > 90% with ambulation and lab work without significant derangement.  Given reassuring vitals and O2 sats do not feel patient will require admission for COVID pneumonia. PCP has already prescribed albuterol, prednisone and Levaquin. Patient referred for possible monoclonal antibody infusion and enrolled in hospital at home program. Strict return precautions discussed. Pt expresses understanding and agrees with plan. Discharged home in good condition.  Tyger Oka Hallmark was evaluated in Emergency Department on 05/15/2019 for the symptoms described in the history of present illness. She was evaluated in the context of the global COVID-19 pandemic, which necessitated consideration that the patient might be at risk for infection with the SARS-CoV-2 virus that causes COVID-19. Institutional protocols and algorithms that pertain to the evaluation of patients at risk for COVID-19 are in a state of rapid change based on information released by regulatory bodies including the CDC and federal and state organizations. These policies and algorithms were followed during the patient's care in the ED.  Final Clinical Impression(s) / ED Diagnoses Final diagnoses:  Pneumonia due to COVID-19 virus    Rx / DC Orders ED Discharge Orders    None       Legrand Rams 05/15/19 2336    Margarita Grizzle, MD 05/21/19 1341

## 2019-05-15 ENCOUNTER — Ambulatory Visit (HOSPITAL_COMMUNITY)
Admission: RE | Admit: 2019-05-15 | Discharge: 2019-05-15 | Disposition: A | Discharge status: Home / Self Care | Payer: BLUE CROSS/BLUE SHIELD | Source: Ambulatory Visit | Attending: Pulmonary Disease | Admitting: Pulmonary Disease

## 2019-05-15 ENCOUNTER — Encounter (HOSPITAL_COMMUNITY): Payer: Self-pay

## 2019-05-15 DIAGNOSIS — U071 COVID-19: Secondary | ICD-10-CM | POA: Insufficient documentation

## 2019-05-15 MED ORDER — SODIUM CHLORIDE 0.9 % IV SOLN
700.0000 mg | Freq: Once | INTRAVENOUS | Status: AC
  Administered 2019-05-15: 700 mg via INTRAVENOUS
  Filled 2019-05-15: qty 20

## 2019-05-15 MED ORDER — EPINEPHRINE 0.3 MG/0.3ML IJ SOAJ: 0.3000 mg | Freq: Once | INTRAMUSCULAR | Status: DC | PRN

## 2019-05-15 MED ORDER — SODIUM CHLORIDE 0.9 % IV SOLN
INTRAVENOUS | Status: DC | PRN
  Administered 2019-05-15: 250 mL via INTRAVENOUS

## 2019-05-15 MED ORDER — DIPHENHYDRAMINE HCL 50 MG/ML IJ SOLN: 50.0000 mg | Freq: Once | INTRAMUSCULAR | Status: DC | PRN

## 2019-05-15 MED ORDER — METHYLPREDNISOLONE SODIUM SUCC 125 MG IJ SOLR: 125.0000 mg | Freq: Once | INTRAMUSCULAR | Status: DC | PRN

## 2019-05-15 MED ORDER — ALBUTEROL SULFATE HFA 108 (90 BASE) MCG/ACT IN AERS: 2.0000 | INHALATION_SPRAY | Freq: Once | RESPIRATORY_TRACT | Status: DC | PRN

## 2019-05-15 MED ORDER — FAMOTIDINE IN NACL 20-0.9 MG/50ML-% IV SOLN: 20.0000 mg | Freq: Once | INTRAVENOUS | Status: DC | PRN

## 2019-05-15 NOTE — Progress Notes (Signed)
  Diagnosis: COVID-19  Physician: Dr. Wright  Procedure: [facility] Infusion [facility] Med: bamlanivimab infusion - Provided patient with bamlanimivab fact sheet for patients, parents and caregivers prior to infusion.  Complications: No immediate complications noted.  Discharge: Discharged home   Madeline Mcmillan N Madeline Mcmillan 05/15/2019   

## 2019-05-15 NOTE — Discharge Instructions (Signed)

## 2019-05-20 DIAGNOSIS — Y708 Miscellaneous anesthesiology devices associated with adverse incidents, not elsewhere classified: Secondary | ICD-10-CM | POA: Diagnosis not present

## 2019-05-20 DIAGNOSIS — Z6825 Body mass index (BMI) 25.0-25.9, adult: Secondary | ICD-10-CM | POA: Diagnosis not present

## 2019-05-20 DIAGNOSIS — U071 COVID-19: Secondary | ICD-10-CM | POA: Diagnosis not present

## 2019-05-27 DIAGNOSIS — U071 COVID-19: Secondary | ICD-10-CM | POA: Diagnosis not present

## 2019-05-27 DIAGNOSIS — Z6825 Body mass index (BMI) 25.0-25.9, adult: Secondary | ICD-10-CM | POA: Diagnosis not present

## 2019-06-10 DIAGNOSIS — C44329 Squamous cell carcinoma of skin of other parts of face: Secondary | ICD-10-CM | POA: Diagnosis not present

## 2019-06-10 DIAGNOSIS — L719 Rosacea, unspecified: Secondary | ICD-10-CM | POA: Diagnosis not present

## 2019-06-16 DIAGNOSIS — U071 COVID-19: Secondary | ICD-10-CM | POA: Diagnosis not present

## 2019-06-16 DIAGNOSIS — J1282 Pneumonia due to coronavirus disease 2019: Secondary | ICD-10-CM | POA: Diagnosis not present

## 2019-06-16 DIAGNOSIS — R918 Other nonspecific abnormal finding of lung field: Secondary | ICD-10-CM | POA: Diagnosis not present

## 2019-06-16 DIAGNOSIS — J189 Pneumonia, unspecified organism: Secondary | ICD-10-CM | POA: Diagnosis not present

## 2019-06-16 DIAGNOSIS — Y748 Miscellaneous general hospital and personal-use devices associated with adverse incidents, not elsewhere classified: Secondary | ICD-10-CM | POA: Diagnosis not present

## 2019-06-24 DIAGNOSIS — Z6825 Body mass index (BMI) 25.0-25.9, adult: Secondary | ICD-10-CM | POA: Diagnosis not present

## 2019-06-24 DIAGNOSIS — M4310 Spondylolisthesis, site unspecified: Secondary | ICD-10-CM | POA: Diagnosis not present

## 2019-07-03 DIAGNOSIS — Z79899 Other long term (current) drug therapy: Secondary | ICD-10-CM | POA: Diagnosis not present

## 2019-07-03 DIAGNOSIS — R0602 Shortness of breath: Secondary | ICD-10-CM | POA: Diagnosis not present

## 2019-07-03 DIAGNOSIS — K760 Fatty (change of) liver, not elsewhere classified: Secondary | ICD-10-CM | POA: Diagnosis not present

## 2019-07-03 DIAGNOSIS — R0600 Dyspnea, unspecified: Secondary | ICD-10-CM | POA: Diagnosis not present

## 2019-07-03 DIAGNOSIS — I4891 Unspecified atrial fibrillation: Secondary | ICD-10-CM | POA: Diagnosis not present

## 2019-07-03 DIAGNOSIS — R109 Unspecified abdominal pain: Secondary | ICD-10-CM | POA: Diagnosis not present

## 2019-07-03 DIAGNOSIS — Z880 Allergy status to penicillin: Secondary | ICD-10-CM | POA: Diagnosis not present

## 2019-07-03 DIAGNOSIS — Z8616 Personal history of COVID-19: Secondary | ICD-10-CM | POA: Diagnosis not present

## 2019-07-03 DIAGNOSIS — E876 Hypokalemia: Secondary | ICD-10-CM | POA: Diagnosis not present

## 2019-07-07 DIAGNOSIS — Z6825 Body mass index (BMI) 25.0-25.9, adult: Secondary | ICD-10-CM | POA: Diagnosis not present

## 2019-07-07 DIAGNOSIS — R1013 Epigastric pain: Secondary | ICD-10-CM | POA: Diagnosis not present

## 2019-07-08 DIAGNOSIS — R1013 Epigastric pain: Secondary | ICD-10-CM | POA: Diagnosis not present

## 2019-07-13 DIAGNOSIS — K222 Esophageal obstruction: Secondary | ICD-10-CM | POA: Diagnosis not present

## 2019-07-13 DIAGNOSIS — R1013 Epigastric pain: Secondary | ICD-10-CM | POA: Diagnosis not present

## 2019-07-13 DIAGNOSIS — R140 Abdominal distension (gaseous): Secondary | ICD-10-CM | POA: Diagnosis not present

## 2019-07-20 ENCOUNTER — Encounter: Payer: Self-pay | Admitting: Cardiology

## 2019-07-20 ENCOUNTER — Ambulatory Visit: Payer: BLUE CROSS/BLUE SHIELD | Admitting: Cardiology

## 2019-07-20 ENCOUNTER — Other Ambulatory Visit: Payer: Self-pay

## 2019-07-20 VITALS — BP 134/80 | HR 62 | Ht 63.0 in | Wt 142.0 lb

## 2019-07-20 DIAGNOSIS — I480 Paroxysmal atrial fibrillation: Secondary | ICD-10-CM

## 2019-07-20 DIAGNOSIS — R5383 Other fatigue: Secondary | ICD-10-CM

## 2019-07-20 DIAGNOSIS — U071 COVID-19: Secondary | ICD-10-CM | POA: Diagnosis not present

## 2019-07-20 NOTE — Patient Instructions (Addendum)
Medication Instructions:  Your physician recommends that you continue on your current medications as directed. Please refer to the Current Medication list given to you today.  *If you need a refill on your cardiac medications before your next appointment, please call your pharmacy*   Lab Work: None ordered   Testing/Procedures: Your physician has requested that you have an echocardiogram. Echocardiography is a painless test that uses sound waves to create images of your heart. It provides your doctor with information about the size and shape of your heart and how well your heart's chambers and valves are working. This procedure takes approximately one hour. There are no restrictions for this procedure.   Follow-Up: At [facility], you and your health needs are our priority.  As part of our continuing mission to provide you with exceptional heart care, we have created designated Provider Care Teams.  These Care Teams include your primary Cardiologist (physician) and Advanced Practice Providers (APPs -  Physician Assistants and Nurse Practitioners) who all work together to provide you with the care you need, when you need it.  We recommend signing up for the patient portal called "MyChart".  Sign up information is provided on this After Visit Summary.  MyChart is used to connect with patients for Virtual Visits (Telemedicine).  Patients are able to view lab/test results, encounter notes, upcoming appointments, etc.  Non-urgent messages can be sent to your provider as well.   To learn more about what you can do with MyChart, go to [email or web address].    Your next appointment:   6 month(s)  The format for your next appointment:   In Person  Provider:   Loman Brooklyn, MD   Thank you for choosing [facility]!!   Dory Horn, RN [phone]    Other Instructions

## 2019-07-20 NOTE — Progress Notes (Signed)
Electrophysiology Office Note   Date:  07/20/2019   ID:  SETH FRIEDLANDER, DOB 1955-06-09, MRN [id number]  PCP:  Ninfa Meeker, FNP  Cardiologist:   Primary Electrophysiologist:  Prescott Truex Jorja Loa, MD    No chief complaint on file.    History of Present Illness: Madeline Mcmillan is a 64 y.o. female who is being seen today for the evaluation of atrial fibrillation at the request of Cox, Kirsten, MD. Presenting today for electrophysiology evaluation.  She has a history of hyperlipidemia, and atrial fibrillation.  In February 2018, she was walking felt shaky and short of breath.  Her symptoms persisted and she went to a local hospital where she was diagnosed with atrial fibrillation with rapid ventricular response.  She was placed on diltiazem and spontaneously converted to sinus rhythm.  She underwent stress testing which showed normal perfusion as well as an echo with normal LV systolic function and normal biatrial size.  She was started on flecainide.  She had side effects from flecainide and thus it was discontinued.  She had a subsequent episode of atrial fibrillation May 2018 where she had palpitations.  Metoprolol was started at the time.  She had side effects her metoprolol which was discontinued.  Today, denies symptoms of palpitations, chest pain, shortness of breath, orthopnea, PND, lower extremity edema, claudication, dizziness, presyncope, syncope, bleeding, or neurologic sequela. The patient is tolerating medications without difficulties.  She has had minimal episodes of atrial fibrillation over the last year.  Her last dose of flecainide was in November.  She does have short episodes lasting less than 30 minutes.  In February she was diagnosed with Covid.  Since then, she has felt quite weak and fatigued.  He has had a very long recovery.  She was planning to get back surgery and was diagnosed on her presurgical screen.   Past Medical History:  Diagnosis Date  . Atrial  fibrillation ([facility]) 05/2016  . Hyperlipidemia 2014  . Migraine headache   . Pain in left hip    POSTERIOR AND LATERAL   . Palpitations    Past Surgical History:  Procedure Laterality Date  . BACK SURGERY  2002  . CESAREAN SECTION  1989, 1991  . FOOT SURGERY  2007  . TONSILLECTOMY  1965     Current Outpatient Medications  Medication Sig Dispense Refill  . atorvastatin (LIPITOR) 10 MG tablet Take 10 mg by mouth daily.    . calcium carbonate (OSCAL) 1500 (600 Ca) MG TABS tablet Take by mouth 2 (two) times daily with a meal.    . dicyclomine (BENTYL) 10 MG capsule Take 10 mg by mouth 3 (three) times daily. 0    . diltiazem (CARDIZEM) 30 MG tablet Take 1 tablet (30 mg total) by mouth 4 (four) times daily as needed (for elevated heart rates). 30 tablet 2  . flecainide (TAMBOCOR) 100 MG tablet May take up to 300 mg of Flecainide daily as needed for Atrial fibrillation/palpitations. (Patient taking differently: Take 300 mg by mouth as needed (r Atrial fibrillation/palpitations.). May take up to 300 mg of Flecainide daily as needed for Atrial fibrillation/palpitations.) 30 tablet 2  . Omega-3 Fatty Acids (FISH OIL) 1200 MG CAPS Take 1,200 mg by mouth daily.     Marland Kitchen albuterol (VENTOLIN HFA) 108 (90 Base) MCG/ACT inhaler Inhale 1 puff into the lungs every 6 (six) hours as needed.    Marland Kitchen levofloxacin (LEVAQUIN) 750 MG tablet Take 750 mg by mouth daily.    Marland Kitchen  omeprazole (PRILOSEC) 40 MG capsule Take 40 mg by mouth daily.    . predniSONE (DELTASONE) 20 MG tablet Take 20 mg by mouth 2 (two) times daily.    Marland Kitchen VIRTUSSIN A/C 100-10 MG/5ML syrup Take 10 mLs by mouth every 8 (eight) hours as needed.     No current facility-administered medications for this visit.    Allergies:   Amoxicillin and Penicillins   Social History:  The patient  reports that she has never smoked. She has never used smokeless tobacco. She reports that she does not drink alcohol or use drugs.   Family History:  The patient's family  history includes CAD in her mother; Cerebral aneurysm in her brother; Healthy in her sister; Other in her father.    ROS:  Please see the history of present illness.   Otherwise, review of systems is positive for none.   All other systems are reviewed and negative.   PHYSICAL EXAM: VS:  BP 134/80   Pulse 62   Ht 5\' 3"  (1.6 m)   Wt 142 lb (64.4 kg)   BMI 25.15 kg/m  , BMI Body mass index is 25.15 kg/m. GEN: Well nourished, well developed, in no acute distress  HEENT: normal  Neck: no JVD, carotid bruits, or masses Cardiac: RRR; no murmurs, rubs, or gallops,no edema  Respiratory:  clear to auscultation bilaterally, normal work of breathing GI: soft, nontender, nondistended, + BS MS: no deformity or atrophy  Skin: warm and dry Neuro:  Strength and sensation are intact Psych: euthymic mood, full affect  EKG:  EKG is ordered today. Personal review of the ekg ordered shows sinus rhythm, rate 62  Recent Labs: 05/14/2019: BUN 12; Creatinine, Ser 0.66; Hemoglobin 13.9; Platelets 119; Potassium 3.4; Sodium 137    Lipid Panel  No results found for: CHOL, TRIG, HDL, CHOLHDL, VLDL, LDLCALC, LDLDIRECT   Wt Readings from Last 3 Encounters:  07/20/19 142 lb (64.4 kg)  05/14/19 145 lb (65.8 kg)  10/28/18 150 lb (68 kg)      Other studies Reviewed: Additional studies/ records that were reviewed today include: ETT 12/25/17  Review of the above records today demonstrates:  BASELINE EKG NORMAL AT 74/MIN.  PT EXERCISED 7 MIN, BRUCE AT 8 METS, ABOVE AVERAGE FUNCTIONAL CAPACITY FOR AGE. ACHIEVED HR 136/MIN 86% MPHR. NO ANGINA OR ST ABNORMALITY RARE PAC/PVC.   ASSESSMENT AND PLAN:  1.  Paroxysmal atrial fibrillation: Diagnosed with atrial fibrillation in [place].  CHA2DS2-VASc of 1.  Currently on flecainide and diltiazem as needed.  She has had to take it once over the last year.  We Armonie Mettler continue with current management.  2.  COVID-19: Had an infection in February.  Is unfortunately  continued to have weakness and fatigue.  She had a BNP which was normal.  I do think that she would benefit from an echo to see if she has any cardiac complications.  She is also not had an echo in 3 years since her diagnosis of atrial fibrillation.   Current medicines are reviewed at length with the patient today.   The patient does not have concerns regarding her medicines.  The following changes were made today: None  Labs/ tests ordered today include:  Orders Placed This Encounter  Procedures  . EKG 12-Lead  . ECHOCARDIOGRAM COMPLETE     Disposition:   FU with Keiden Deskin 6 months  Signed, Quade Ramirez Meredith Leeds, MD  07/20/2019 4:14 PM     [place]  [address] [address] [phone] (office) [phone] (fax)

## 2019-08-03 ENCOUNTER — Other Ambulatory Visit: Payer: Self-pay

## 2019-08-03 ENCOUNTER — Ambulatory Visit (INDEPENDENT_AMBULATORY_CARE_PROVIDER_SITE_OTHER): Payer: BLUE CROSS/BLUE SHIELD

## 2019-08-03 DIAGNOSIS — U071 COVID-19: Secondary | ICD-10-CM

## 2019-08-03 DIAGNOSIS — I480 Paroxysmal atrial fibrillation: Secondary | ICD-10-CM

## 2019-08-03 DIAGNOSIS — R5383 Other fatigue: Secondary | ICD-10-CM

## 2019-08-03 NOTE — Progress Notes (Signed)
Complete echocardiogram performed.  Jimmy Adira Limburg RDCS, RVT  

## 2019-08-04 DIAGNOSIS — M542 Cervicalgia: Secondary | ICD-10-CM | POA: Diagnosis not present

## 2019-08-04 DIAGNOSIS — E663 Overweight: Secondary | ICD-10-CM | POA: Diagnosis not present

## 2019-08-04 DIAGNOSIS — R42 Dizziness and giddiness: Secondary | ICD-10-CM | POA: Diagnosis not present

## 2019-08-12 DIAGNOSIS — Z23 Encounter for immunization: Secondary | ICD-10-CM | POA: Diagnosis not present

## 2019-08-26 DIAGNOSIS — M4310 Spondylolisthesis, site unspecified: Secondary | ICD-10-CM | POA: Diagnosis not present

## 2019-09-07 ENCOUNTER — Telehealth: Payer: Self-pay | Admitting: *Deleted

## 2019-09-07 NOTE — Telephone Encounter (Signed)
   [facility] Pre-operative Risk Assessment    HEARTCARE STAFF: - Please ensure there is not already an duplicate clearance open for this procedure. - Under Visit Info/Reason for Call, type in Other and utilize the format Clearance MM/DD/YY or Clearance TBD. Do not use dashes or single digits. - If request is for dental extraction, please clarify the # of teeth to be extracted.  Request for surgical clearance:  1. What type of surgery is being performed? L4-5 LUMBAR FUSION   2. When is this surgery scheduled? 09/22/19   3. What type of clearance is required (medical clearance vs. Pharmacy clearance to hold med vs. Both)? MEDICAL  4. Are there any medications that need to be held prior to surgery and how long? NONE LISTED    5. Practice name and name of physician performing surgery? [facility]; DR. Mallie Mussel POOL   6. What is the office phone number? [phone]   7.   What is the office fax number? [phone] ATTN:VANESSA x-[phone]  8.   Anesthesia type (None, local, MAC, general) ? GENERAL   Madeline Mcmillan 09/07/2019, 3:59 PM  _________________________________________________________________   (provider comments below)

## 2019-09-07 NOTE — Telephone Encounter (Signed)
   Primary Cardiologist: Will Jorja Loa, MD  Chart reviewed as part of pre-operative protocol coverage. The patient was doing well on cardiac stand point when last seen by Dr. Elberta Fortis 07/20/19. Had normal follow up routine echo as well.   Given past medical history and time since last visit, based on ACC/AHA guidelines, Madeline Mcmillan would be at acceptable risk for the planned procedure without further cardiovascular testing.   I will route this recommendation to the requesting party via Epic fax function and remove from pre-op pool.  Please call with questions.  [place], [place] 09/07/2019, 4:14 PM

## 2019-09-08 DIAGNOSIS — Z23 Encounter for immunization: Secondary | ICD-10-CM | POA: Diagnosis not present

## 2019-09-15 DIAGNOSIS — H5213 Myopia, bilateral: Secondary | ICD-10-CM | POA: Diagnosis not present

## 2019-09-15 DIAGNOSIS — Z01818 Encounter for other preprocedural examination: Secondary | ICD-10-CM | POA: Diagnosis not present

## 2019-09-15 DIAGNOSIS — M4310 Spondylolisthesis, site unspecified: Secondary | ICD-10-CM | POA: Diagnosis not present

## 2019-09-22 DIAGNOSIS — M4316 Spondylolisthesis, lumbar region: Secondary | ICD-10-CM | POA: Diagnosis not present

## 2019-09-22 DIAGNOSIS — M4306 Spondylolysis, lumbar region: Secondary | ICD-10-CM | POA: Diagnosis not present

## 2019-09-22 DIAGNOSIS — M48061 Spinal stenosis, lumbar region without neurogenic claudication: Secondary | ICD-10-CM | POA: Diagnosis not present

## 2019-09-24 ENCOUNTER — Encounter (HOSPITAL_COMMUNITY): Payer: Self-pay

## 2019-09-24 ENCOUNTER — Emergency Department (HOSPITAL_COMMUNITY)
Admission: EM | Admit: 2019-09-24 | Discharge: 2019-09-24 | Disposition: A | Discharge status: Home / Self Care | Payer: BLUE CROSS/BLUE SHIELD | Attending: Emergency Medicine | Admitting: Emergency Medicine

## 2019-09-24 ENCOUNTER — Other Ambulatory Visit: Payer: Self-pay

## 2019-09-24 ENCOUNTER — Emergency Department (HOSPITAL_COMMUNITY): Payer: BLUE CROSS/BLUE SHIELD

## 2019-09-24 DIAGNOSIS — Z79899 Other long term (current) drug therapy: Secondary | ICD-10-CM | POA: Diagnosis not present

## 2019-09-24 DIAGNOSIS — R55 Syncope and collapse: Secondary | ICD-10-CM

## 2019-09-24 DIAGNOSIS — S199XXA Unspecified injury of neck, initial encounter: Secondary | ICD-10-CM | POA: Diagnosis not present

## 2019-09-24 DIAGNOSIS — S0990XA Unspecified injury of head, initial encounter: Secondary | ICD-10-CM | POA: Diagnosis not present

## 2019-09-24 LAB — CBC
HCT: 39.6 % (ref 36.0–46.0)
Hemoglobin: 12.8 g/dL (ref 12.0–15.0)
MCH: 30.4 pg (ref 26.0–34.0)
MCHC: 32.3 g/dL (ref 30.0–36.0)
MCV: 94.1 fL (ref 80.0–100.0)
Platelets: 179 10*3/uL (ref 150–400)
RBC: 4.21 MIL/uL (ref 3.87–5.11)
RDW: 13 % (ref 11.5–15.5)
WBC: 12.4 10*3/uL — ABNORMAL HIGH (ref 4.0–10.5)
nRBC: 0 % (ref 0.0–0.2)

## 2019-09-24 LAB — BASIC METABOLIC PANEL
Anion gap: 10 (ref 5–15)
BUN: 10 mg/dL (ref 8–23)
CO2: 23 mmol/L (ref 22–32)
Calcium: 9.6 mg/dL (ref 8.9–10.3)
Chloride: 107 mmol/L (ref 98–111)
Creatinine, Ser: 0.71 mg/dL (ref 0.44–1.00)
GFR calc Af Amer: >60 mL/min (ref >60)
GFR calc non Af Amer: >60 mL/min (ref >60)
Glucose, Bld: 100 mg/dL — ABNORMAL HIGH (ref 70–99)
Potassium: 3.6 mmol/L (ref 3.5–5.1)
Sodium: 140 mmol/L (ref 135–145)

## 2019-09-24 MED ORDER — SODIUM CHLORIDE 0.9% FLUSH: 3.0000 mL | Freq: Once | INTRAVENOUS | Status: DC

## 2019-09-24 MED ORDER — DOXYCYCLINE HYCLATE 50 MG PO CAPS
50.0000 mg | ORAL_CAPSULE | Freq: Two times a day (BID) | ORAL | 0 refills | Status: DC
Start: 2019-09-24 — End: 2019-12-08

## 2019-09-24 NOTE — ED Triage Notes (Signed)
Patient had back surgery on Tuesday and had syncopal episode this am. Took oxycodone and flexeril at 0530 and ate minimal and than passed out at 0830. Patient alert and oriented, nad

## 2019-09-24 NOTE — Discharge Instructions (Signed)
Take pain medication as needed.   Make sure you are staying hydrated and drinking plenty of fluid and stay hydrated.   Follow up with neurosurgery as directed.   Return to the Emergency Dept  for any chest pain, difficulty breathing, feeling lightheaded or any other worsening or concerning symptoms.

## 2019-09-24 NOTE — ED Provider Notes (Signed)
[facility] EMERGENCY DEPARTMENT Provider Note   CSN: [id number] Arrival date & time: 09/24/19  1016     History No chief complaint on file.   Madeline Mcmillan is a 64 y.o. female with past history of proximal A. fib, hyperlipidemia, recent L4-L5 fusion (09/22/19) who presents for evaluation of syncopal episode that occurred this morning about 8 AM.  Patient reports that she had taken a shower and states that she was getting out of the shower and felt lightheaded like she was going to pass out.  She reports that she sat down and asked her husband to go get her some close.  She reports that she still felt lightheaded and dizzy and then had a syncopal episode.  She does report that she fell to the floor from a seated position.  Unsure if she hit her head.  Does report positive LOC.  Husband stated that this lasted for about 30 seconds.  He came running and after he heard her fall.  No shaking or seizure activity noted.  Patient reports she denies any preceding chest pain.  She reports that she has had some pain associated with her recent L4-L5 fusion.  She reports taking oxycodone and Flexeril at about 5:30 AM this morning. She tried eating a popstart so she would have something on her stomach but other than that, she had not eaten much this AM.  Currently reports she is back to her baseline.  She is not on blood thinners.  Denies any chest pain, difficulty breathing, abdominal pain, nausea/vomiting, numbness/weakness of arms or legs.  She reports she is not on blood thinners for her A. fib.  She does report that she had the surgery on 09/22/2019 and was admitted overnight.  She was discharged on 09/23/2019.  She is not on any estrogen, hormone pills.  She denies any history of DVT or PE, swelling in her legs, recent trips.  The history is provided by the patient.       Past Medical History:  Diagnosis Date  . Atrial fibrillation (HCC) 05/2016  . Hyperlipidemia 2014  . Migraine  headache   . Pain in left hip    POSTERIOR AND LATERAL   . Palpitations     There are no problems to display for this patient.   Past Surgical History:  Procedure Laterality Date  . BACK SURGERY  2002  . CESAREAN SECTION  1989, 1991  . FOOT SURGERY  2007  . TONSILLECTOMY  1965     OB History   No obstetric history on file.     Family History  Problem Relation Age of Onset  . CAD Mother   . Other Father        BACK SURGERY, HIP REPLACEMENT  . Healthy Sister   . Cerebral aneurysm Brother     Social History   Tobacco Use  . Smoking status: Never Smoker  . Smokeless tobacco: Never Used  Substance Use Topics  . Alcohol use: Never  . Drug use: Never    Home Medications Prior to Admission medications   Medication Sig Start Date End Date Taking? Authorizing Provider  atorvastatin (LIPITOR) 10 MG tablet Take 10 mg by mouth at bedtime.  03/18/19  Yes [provider]  cyclobenzaprine (FLEXERIL) 10 MG tablet Take 10 mg by mouth 3 (three) times daily.   Yes [provider]  diltiazem (CARDIZEM) 30 MG tablet Take 1 tablet (30 mg total) by mouth 4 (four) times daily as needed (  for elevated heart rates). 10/28/18  Yes Camnitz, Andree Coss, MD  flecainide [facility]) 100 MG tablet May take up to 300 mg of Flecainide daily as needed for Atrial fibrillation/palpitations. Patient taking differently: Take 100-300 mg by mouth as needed (Atrial fibrillation/palpitations.). May take up to 300 mg of Flecainide daily as needed for Atrial fibrillation/palpitations. 01/29/19  Yes Camnitz, Andree Coss, MD  oxyCODONE-acetaminophen (PERCOCET/ROXICET) 5-325 MG tablet Take 1 tablet by mouth every 6 (six) hours as needed for severe pain.   Yes [provider]  albuterol (VENTOLIN HFA) 108 (90 Base) MCG/ACT inhaler Inhale 1 puff into the lungs every 6 (six) hours as needed. 05/14/19   [provider]  doxycycline (VIBRAMYCIN) 50 MG capsule Take 1 capsule (50 mg total)  by mouth 2 (two) times daily. 09/24/19   Val Eagle D, NP  levofloxacin (LEVAQUIN) 750 MG tablet Take 750 mg by mouth daily. 05/14/19   [provider]  predniSONE (DELTASONE) 20 MG tablet Take 20 mg by mouth 2 (two) times daily. 05/14/19   [provider]  VIRTUSSIN A/C 100-10 MG/5ML syrup Take 10 mLs by mouth every 8 (eight) hours as needed. 05/11/19   [provider]    Allergies    Amoxicillin and Penicillins  Review of Systems   Review of Systems  Constitutional: Negative for fever.  Respiratory: Negative for cough and shortness of breath.   Cardiovascular: Negative for chest pain.  Gastrointestinal: Negative for abdominal pain, nausea and vomiting.  Neurological: Positive for syncope. Negative for weakness, numbness and headaches.  All other systems reviewed and are negative.   Physical Exam Updated Vital Signs BP 119/83   Pulse 88   Temp 99.7 F (37.6 C)   Resp (!) 23   Ht  (1.6 m)   Wt 64 kg   SpO2 98%   BMI 24.98 kg/m   Physical Exam Vitals and nursing note reviewed.  Constitutional:      Appearance: Normal appearance. She is well-developed.  HENT:     Head: Normocephalic and atraumatic.     Comments: No tenderness to palpation of skull. No deformities or crepitus noted. No open wounds, abrasions or lacerations.  Eyes:     General: Lids are normal.     Conjunctiva/sclera: Conjunctivae normal.     Pupils: Pupils are equal, round, and reactive to light.     Comments: PERRL. EOMs intact. No nystagmus. No neglect.   Neck:     Comments: Full flexion/extension and lateral movement of neck fully intact. No bony midline tenderness. No deformities or crepitus.  Cardiovascular:     Rate and Rhythm: Normal rate and regular rhythm.     Pulses: Normal pulses.          Radial pulses are 2+ on the right side and 2+ on the left side.       Dorsalis pedis pulses are 2+ on the right side and 2+ on the left side.     Heart sounds: Normal heart  sounds. No murmur heard.  No friction rub. No gallop.   Pulmonary:     Effort: Pulmonary effort is normal.     Breath sounds: Normal breath sounds.  Abdominal:     Palpations: Abdomen is soft. Abdomen is not rigid.     Tenderness: There is no abdominal tenderness. There is no guarding.  Musculoskeletal:        General: Normal range of motion.     Cervical back: Full passive range of motion without pain.  Comments: Lumbar brace in place.  Skin:    General: Skin is warm and dry.     Capillary Refill: Capillary refill takes less than 2 seconds.     Comments: Well-appearing surgical incision site noted to the lumbar region.  No overlying warmth, erythema.  No purulent drainage.  Neurological:     Mental Status: She is alert and oriented to person, place, and time.     Comments: Cranial nerves III-XII intact Follows commands, Moves all extremities  5/5 strength to BUE and BLE  Sensation intact throughout all major nerve distributions No slurred speech. No facial droop.   Psychiatric:        Speech: Speech normal.     ED Results / Procedures / Treatments   Labs (all labs ordered are listed, but only abnormal results are displayed) Labs Reviewed  BASIC METABOLIC PANEL - Abnormal; Notable for the following components:      Result Value   Glucose, Bld 100 (*)    All other components within normal limits  CBC - Abnormal; Notable for the following components:   WBC 12.4 (*)    All other components within normal limits  CBG MONITORING, ED    EKG EKG Interpretation  Date/Time:  Thursday September 24 2019 10:44:58 EDT Ventricular Rate:  81 PR Interval:  146 QRS Duration: 78 QT Interval:  372 QTC Calculation: 432 R Axis:   2 Text Interpretation: Normal sinus rhythm Normal ECG Confirmed by Raeford Razor [phone]) on 09/24/2019 12:20:28 PM   Radiology CT Head Wo Contrast  Result Date: 09/24/2019 CLINICAL DATA:  Patient fell, does not of she struck her head, possible loss of  consciousness; history atrial fibrillation, migraine EXAM: CT HEAD WITHOUT CONTRAST CT CERVICAL SPINE WITHOUT CONTRAST TECHNIQUE: Multidetector CT imaging of the head and cervical spine was performed following the standard protocol without intravenous contrast. Multiplanar CT image reconstructions of the cervical spine were also generated. COMPARISON:  10/16/2016 FINDINGS: CT HEAD FINDINGS Brain: Normal ventricular morphology. No midline shift or mass effect. Small old infarct at inferior LEFT cerebellar hemisphere. Probable prominent perivascular space at inferior RIGHT basal ganglia unchanged. No intracranial hemorrhage, mass lesion or evidence of acute infarction. No extra-axial fluid collections. Vascular: No hyperdense vessels Skull: Intact Sinuses/Orbits: Clear Other: N/A CT CERVICAL SPINE FINDINGS Alignment: Normal Skull base and vertebrae: Osseous demineralization. Disc space narrowing and minor endplate spur formation C4-C5, C5-C6 and C6-C7. Minimal scattered facet degenerative changes. Small bone island LEFT C4 vertebral body. Vertebral body heights maintained without fracture or subluxation. Visualized skull base intact. Soft tissues and spinal canal: Prevertebral soft tissues normal thickness. Disc levels: Small central disc protrusion at C4-C5. Endplate spurring effaces CSF at the ventral aspect of thecal sac at C5-C6. 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 No acute intracranial abnormalities. Degenerative disc and facet disease changes of the cervical spine. Small central disc protrusion at C4-C5 and endplate spurring at C5-C6. No acute cervical spine abnormalities. Electronically Signed   By: Ulyses Southward M.D.   On: 09/24/2019 13:09   CT Cervical Spine Wo Contrast  Result Date: 09/24/2019 CLINICAL DATA:  Patient fell, does not of she struck her head, possible loss of consciousness; history atrial fibrillation, migraine EXAM: CT HEAD WITHOUT CONTRAST  CT CERVICAL SPINE WITHOUT CONTRAST TECHNIQUE: Multidetector CT imaging of the head and cervical spine was performed following the standard protocol without intravenous contrast. Multiplanar CT image reconstructions of the cervical spine were also generated. COMPARISON:  10/16/2016  FINDINGS: CT HEAD FINDINGS Brain: Normal ventricular morphology. No midline shift or mass effect. Small old infarct at inferior LEFT cerebellar hemisphere. Probable prominent perivascular space at inferior RIGHT basal ganglia unchanged. No intracranial hemorrhage, mass lesion or evidence of acute infarction. No extra-axial fluid collections. Vascular: No hyperdense vessels Skull: Intact Sinuses/Orbits: Clear Other: N/A CT CERVICAL SPINE FINDINGS Alignment: Normal Skull base and vertebrae: Osseous demineralization. Disc space narrowing and minor endplate spur formation C4-C5, C5-C6 and C6-C7. Minimal scattered facet degenerative changes. Small bone island LEFT C4 vertebral body. Vertebral body heights maintained without fracture or subluxation. Visualized skull base intact. Soft tissues and spinal canal: Prevertebral soft tissues normal thickness. Disc levels: Small central disc protrusion at C4-C5. Endplate spurring effaces CSF at the ventral aspect of thecal sac at C5-C6. 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 No acute intracranial abnormalities. Degenerative disc and facet disease changes of the cervical spine. Small central disc protrusion at C4-C5 and endplate spurring at C5-C6. No acute cervical spine abnormalities. Electronically Signed   By: Ulyses SouthwardMark  Boles M.D.   On: 09/24/2019 13:09    Procedures Procedures (including critical care time)  Medications Ordered in ED Medications  sodium chloride flush (NS) 0.9 % injection 3 mL (3 mLs Intravenous Not Given 09/24/19 1142)    ED Course  I have reviewed the triage vital signs and the nursing notes.  Pertinent labs & imaging  results that were available during my care of the patient were reviewed by me and considered in my medical decision making (see chart for details).    MDM Rules/Calculators/A&P                          64 year old female with past medical history of paroxysmal A. fib, L4-L5 fusion surgery who presents for evaluation of syncopal episode that occurred this morning.  This occurred after taking Flexeril and oxycodone.  She was getting in the shower and felt lightheaded.  No chest pain, difficulty breathing.  On initial arrival, she is afebrile nontoxic-appearing.  Vital signs are stable.  No evidence of neurological deficits noted on exam.  I do not see any signs of trauma on her exam but given positive LOC, will plan for imaging of head and C-spine.  Patient had prodrome of syncope.  I think this is likely related to the fact that she had not eaten much and that she took oxycodone and Flexeril.  This had been the first time that she had taken Flexeril with her oxycodone.  Doubt ACS etiology but will obtain EKG.  Patient is not having any chest pain or difficulty breathing.  She is not hypoxic or tachycardic here in the ED.  She does have history of paroxysmal A. fib but is not on any blood thinners.  Her EKG does not show any signs of A. Fib. In the ED.  Do not suspect PE as etiology of her symptoms.  BMP shows normal BUN and creatinine.  CBC shows slight leukocytosis at 12.4.  [facility] who evaluated patient in the ED.  Patient's incision site looks well-appearing.  From a neurosurgical standpoint, there is no other acute intervention needed.  If patient cleared from ED, patient can be discharged home per previous postop instructions.  CT head and neck show no acute abnormalities.  There is a small old left cerebellar infarct.  Additionally, she has evidence of degenerative disc and facet disease noted  throughout the cervical spine.  Discussed results with patient.   Informed her of finding on CT scan.  Patient is hemodynamically stable.  She has not had any more syncopal episodes here in the ED.  She has tolerated p.o. without any difficulty.  At this time, I suspect that her syncopal episode was likely due to the fact that she took Flexeril with pain medication for the first time as well as not eating much, and getting out of the shower. At this time, patient exhibits no emergent life-threatening condition that require further evaluation in ED or admission. Discussed patient with Dr. Juleen China who is agreeable to plan. Patient had ample opportunity for questions and discussion. All patient's questions were answered with full understanding. Strict return precautions discussed. Patient expresses understanding and agreement to plan.   Portions of this note were generated with Scientist, clinical (histocompatibility and immunogenetics). Dictation errors may occur despite best attempts at proofreading.  Final Clinical Impression(s) / ED Diagnoses Final diagnoses:  Syncope, unspecified syncope type    Rx / DC Orders ED Discharge Orders         Ordered    doxycycline (VIBRAMYCIN) 50 MG capsule  2 times daily     Discontinue  Reprint     09/24/19 1312           Maxwell Caul, PA-C 09/24/19 1452    Raeford Razor, MD 09/25/19 [phone]

## 2019-09-24 NOTE — Progress Notes (Addendum)
   Providing Compassionate, Quality Care - Together   Subjective: Patient reports bloody drainage from her surgical incision. She has not changed the dressing that was applied in the OR. She denies fever, purulent drainage, or general malaise. She reports her back hurts. She states, "it feels like someone is stabbing me in the back." She reports a syncopal episode this morning. She had taken her pain medication and muscle relaxant with half a Pop-Tart at 5:30 am and gone back to bed. After she woke up, her husband was assisting her to the shower. She had been laying down prior to going into the bathroom. While she was sitting in the bathroom, she got light-headed and had a brief loss of consciousness. She denies nausea, vomiting, changes in speech, focal weakness, chest pain, or shortness of breath.  Objective: Vital signs in last 24 hours: Temp:  [99.7 F (37.6 C)] 99.7 F (37.6 C) (07/08 1037) Pulse Rate:  [80] 80 (07/08 1037) Resp:  [16] 16 (07/08 1037) BP: (113-124)/(54-60) 124/60 (07/08 1038) SpO2:  [100 %] 100 % (07/08 1037) Weight:  [64 kg] 64 kg (07/08 1037)  Intake/Output from previous day: No intake/output data recorded. Intake/Output this shift: No intake/output data recorded.  Alert and oriented x 4 PERRLA CN II-XII grossly intact MAE, Strength and sensation intact Incision is covered with Honeycomb dressing and Steri Strips; Dressing is saturated with old and new sanguinous drainage. Dressing and Steri Strips removed during this assessment. Incision does not appear to be actively draining.   Lab Results: Recent Labs    09/24/19 1101  WBC 12.4*  HGB 12.8  HCT 39.6  PLT 179   BMET Recent Labs    09/24/19 1101  NA 140  K 3.6  CL 107  CO2 23  GLUCOSE 100*  BUN 10  CREATININE 0.71  CALCIUM 9.6    Studies/Results: No results found.  Assessment/Plan: Patient is two days status post posterior lumbar fusion with sanguinous drainage from her incision and a  syncopal episode this morning. Feel syncopal episode was likely orthostatic in nature. ED Provider conducting further workup.   LOS: 0 days    -She does not appear to have an infection. Patient has a PCN allergy, so we will send doxycycline prophylactically to the patient's pharmacy on file. -Advised patient to change dressing with sterile gauze BID and prn to keep incision dry. -Follow up appointment already scheduled with Dr. Jordan Likes for 10/06/2019. She should contact the office if her incisional drainage does not stop over the next few days.   Val Eagle, DNP, AGNP-C Nurse Practitioner  [facility] [address], [address], [place], [place] [address] P: [phone]    F: [phone]  09/24/2019, 12:25 PM

## 2019-10-28 DIAGNOSIS — M4310 Spondylolisthesis, site unspecified: Secondary | ICD-10-CM

## 2019-10-28 HISTORY — DX: Spondylolisthesis, site unspecified: M43.10

## 2019-11-02 DIAGNOSIS — G8929 Other chronic pain: Secondary | ICD-10-CM | POA: Diagnosis not present

## 2019-11-02 DIAGNOSIS — R431 Parosmia: Secondary | ICD-10-CM | POA: Diagnosis not present

## 2019-11-02 DIAGNOSIS — Z6825 Body mass index (BMI) 25.0-25.9, adult: Secondary | ICD-10-CM | POA: Diagnosis not present

## 2019-11-02 DIAGNOSIS — R5383 Other fatigue: Secondary | ICD-10-CM | POA: Diagnosis not present

## 2019-11-02 DIAGNOSIS — M542 Cervicalgia: Secondary | ICD-10-CM | POA: Diagnosis not present

## 2019-11-10 DIAGNOSIS — J984 Other disorders of lung: Secondary | ICD-10-CM | POA: Diagnosis not present

## 2019-11-10 DIAGNOSIS — J9811 Atelectasis: Secondary | ICD-10-CM | POA: Diagnosis not present

## 2019-11-10 DIAGNOSIS — R911 Solitary pulmonary nodule: Secondary | ICD-10-CM | POA: Diagnosis not present

## 2019-11-10 DIAGNOSIS — I700 Atherosclerosis of aorta: Secondary | ICD-10-CM | POA: Diagnosis not present

## 2019-11-10 DIAGNOSIS — M47814 Spondylosis without myelopathy or radiculopathy, thoracic region: Secondary | ICD-10-CM | POA: Diagnosis not present

## 2019-12-01 ENCOUNTER — Other Ambulatory Visit: Payer: Self-pay

## 2019-12-01 ENCOUNTER — Encounter (INDEPENDENT_AMBULATORY_CARE_PROVIDER_SITE_OTHER): Payer: Self-pay | Admitting: Otolaryngology

## 2019-12-01 ENCOUNTER — Ambulatory Visit (INDEPENDENT_AMBULATORY_CARE_PROVIDER_SITE_OTHER): Payer: BLUE CROSS/BLUE SHIELD | Admitting: Otolaryngology

## 2019-12-01 VITALS — Temp 97.9°F

## 2019-12-01 DIAGNOSIS — R439 Unspecified disturbances of smell and taste: Secondary | ICD-10-CM

## 2019-12-01 NOTE — Progress Notes (Signed)
HPI: Madeline Mcmillan is a 64 y.o. female who presents is referred by Dr. Jeanie Sewer in [place] for evaluation of distorted smell.  Apparently patient developed Covid this past February and really did not notice loss of sense of smell or taste but has had a distorted sense of smell since that time.  She also complains of postnasal drainage and drainage in her throat.  She has had no colored discharge from her nose.  She has had no trouble breathing through her nose.Marland Kitchen  Past Medical History:  Diagnosis Date  . Atrial fibrillation (HCC) 05/2016  . Hyperlipidemia 2014  . Migraine headache   . Pain in left hip    POSTERIOR AND LATERAL   . Palpitations    Past Surgical History:  Procedure Laterality Date  . BACK SURGERY  2002  . CESAREAN SECTION  1989, 1991  . FOOT SURGERY  2007  . TONSILLECTOMY  1965   Social History   Socioeconomic History  . Marital status: Married    Spouse name: Not on file  . Number of children: Not on file  . Years of education: Not on file  . Highest education level: Not on file  Occupational History  . Not on file  Tobacco Use  . Smoking status: Never Smoker  . Smokeless tobacco: Never Used  Substance and Sexual Activity  . Alcohol use: Never  . Drug use: Never  . Sexual activity: Not on file    Comment: MARRIED  Other Topics Concern  . Not on file  Social History Narrative  . Not on file   Social Determinants of Health   Financial Resource Strain:   . Difficulty of Paying Living Expenses: Not on file  Food Insecurity:   . Worried About Programme researcher, broadcasting/film/video in the Last Year: Not on file  . Ran Out of Food in the Last Year: Not on file  Transportation Needs:   . Lack of Transportation (Medical): Not on file  . Lack of Transportation (Non-Medical): Not on file  Physical Activity:   . Days of Exercise per Week: Not on file  . Minutes of Exercise per Session: Not on file  Stress:   . Feeling of Stress : Not on file  Social Connections:   .  Frequency of Communication with Friends and Family: Not on file  . Frequency of Social Gatherings with Friends and Family: Not on file  . Attends Religious Services: Not on file  . Active Member of Clubs or Organizations: Not on file  . Attends Banker Meetings: Not on file  . Marital Status: Not on file   Family History  Problem Relation Age of Onset  . CAD Mother   . Other Father        BACK SURGERY, HIP REPLACEMENT  . Healthy Sister   . Cerebral aneurysm Brother    Allergies  Allergen Reactions  . Amoxicillin Rash  . Penicillins Rash    Did it involve swelling of the face/tongue/throat, SOB, or low BP? No Did it involve sudden or severe rash/hives, skin peeling, or any reaction on the inside of your mouth or nose? No Did you need to seek medical attention at a hospital or doctor's office? No When did it last happen?15 years If all above answers are "NO", may proceed with cephalosporin use.   Prior to Admission medications   Medication Sig Start Date End Date Taking? Authorizing Provider  atorvastatin (LIPITOR) 10 MG tablet Take 10 mg by mouth at  bedtime.  03/18/19  Yes [provider]  cyclobenzaprine (FLEXERIL) 10 MG tablet Take 10 mg by mouth 3 (three) times daily.   Yes [provider]  diltiazem (CARDIZEM) 30 MG tablet Take 1 tablet (30 mg total) by mouth 4 (four) times daily as needed (for elevated heart rates). 10/28/18  Yes Camnitz, Andree Coss, MD  doxycycline (VIBRAMYCIN) 50 MG capsule Take 1 capsule (50 mg total) by mouth 2 (two) times daily. 09/24/19  Yes Val Eagle D, NP  flecainide (TAMBOCOR) 100 MG tablet May take up to 300 mg of Flecainide daily as needed for Atrial fibrillation/palpitations. Patient taking differently: Take 100-300 mg by mouth as needed (Atrial fibrillation/palpitations.). May take up to 300 mg of Flecainide daily as needed for Atrial fibrillation/palpitations. 01/29/19  Yes Camnitz, Andree Coss, MD   oxyCODONE-acetaminophen (PERCOCET/ROXICET) 5-325 MG tablet Take 1 tablet by mouth every 6 (six) hours as needed for severe pain.   Yes [provider]  albuterol (VENTOLIN HFA) 108 (90 Base) MCG/ACT inhaler Inhale 1 puff into the lungs every 6 (six) hours as needed. 05/14/19   [provider]  levofloxacin (LEVAQUIN) 750 MG tablet Take 750 mg by mouth daily. 05/14/19   [provider]  predniSONE (DELTASONE) 20 MG tablet Take 20 mg by mouth 2 (two) times daily. 05/14/19   [provider]  VIRTUSSIN A/C 100-10 MG/5ML syrup Take 10 mLs by mouth every 8 (eight) hours as needed. 05/11/19   [provider]     Positive ROS: Otherwise negative  All other systems have been reviewed and were otherwise negative with the exception of those mentioned in the HPI and as above.  Physical Exam: Constitutional: Alert, well-appearing, no acute distress Ears: External ears without lesions or tenderness. Ear canals are clear bilaterally with intact, clear TMs bilaterally. Nasal: External nose without lesions. Septum mildly deviated to the right with clear nasal passages bilaterally.  Both middle meatus regions are clear.  Nasal roof and olfactory region is clear bilaterally.  She does have mild rhinitis but only clear mucus discharge with no signs of infection.. Oral: Lips and gums without lesions. Tongue and palate mucosa without lesions. Posterior oropharynx clear.  Patient status post tonsillectomy with clear mucous membranes.  Posterior oropharynx is clear. Neck: No palpable adenopathy or masses. Respiratory: Breathing comfortably  Skin: No facial/neck lesions or rash noted.  Procedures  Assessment: Dysosmia following Covid in February. Mild rhinitis  Plan: Reviewed with patient that Covid causes distorted smell or loss of sense of smell and 30 to 40% of the people who have Covid.  The vast majority estimated be approximately 95% will recover within 1 month  however a small percentage will have distorted sense of smell for several months.  There is no specific treatment that will improve this. I did suggest trying Nasacort 2 sprays each nostril at night that will help with nasal inflammation.   Narda Bonds, MD   CC:

## 2019-12-03 DIAGNOSIS — R030 Elevated blood-pressure reading, without diagnosis of hypertension: Secondary | ICD-10-CM

## 2019-12-03 DIAGNOSIS — M4310 Spondylolisthesis, site unspecified: Secondary | ICD-10-CM | POA: Diagnosis not present

## 2019-12-03 HISTORY — DX: Elevated blood-pressure reading, without diagnosis of hypertension: R03.0

## 2019-12-08 ENCOUNTER — Other Ambulatory Visit: Payer: Self-pay

## 2019-12-08 ENCOUNTER — Ambulatory Visit: Payer: BLUE CROSS/BLUE SHIELD | Admitting: Sports Medicine

## 2019-12-08 ENCOUNTER — Encounter: Payer: Self-pay | Admitting: Sports Medicine

## 2019-12-08 DIAGNOSIS — I739 Peripheral vascular disease, unspecified: Secondary | ICD-10-CM

## 2019-12-08 DIAGNOSIS — L603 Nail dystrophy: Secondary | ICD-10-CM

## 2019-12-08 DIAGNOSIS — M779 Enthesopathy, unspecified: Secondary | ICD-10-CM

## 2019-12-08 DIAGNOSIS — M19071 Primary osteoarthritis, right ankle and foot: Secondary | ICD-10-CM | POA: Diagnosis not present

## 2019-12-08 NOTE — Progress Notes (Signed)
Subjective: Madeline Mcmillan is a 64 y.o. female patient seen today in office with complaint of mildly painful thickened and elongated toenails with kicking debris underneath especially at the hallux toenails; unable to trim. Patient reports that she has been soaking with Epson salt but has not noticed a difference with her nails and also reports that there is some swelling at the top of the right foot and some occasional pain that she has noticed off and on dull achy in nature worse in the morning when she attempts to stretch or get out of bed.  Patient denies any injury or trauma does admit to history of dropping something on her right fifth toe stays swollen but does not hurt.  Patient denies any other trauma or injuries noted at this time.  Review of systems noncontributory Patient Active Problem List   Diagnosis Date Noted   Encounter for screening colonoscopy 02/19/2019   Other fatigue 12/12/2017   Anxiety 12/11/2016   Bradycardia 09/04/2016   Atrial fibrillation (HCC) 08/29/2016   Migraine 08/29/2016   On anticoagulant therapy 08/29/2016    Current Outpatient Medications on File Prior to Visit  Medication Sig Dispense Refill   CALCIUM PO Take by mouth.     atorvastatin (LIPITOR) 10 MG tablet Take 10 mg by mouth at bedtime.      Omega-3 Fatty Acids (FISH OIL) 1000 MG CPDR Fish Oil     No current facility-administered medications on file prior to visit.    Allergies  Allergen Reactions   Amoxicillin Rash   Penicillins Rash    Did it involve swelling of the face/tongue/throat, SOB, or low BP? No Did it involve sudden or severe rash/hives, skin peeling, or any reaction on the inside of your mouth or nose? No Did you need to seek medical attention at a hospital or doctor's office? No When did it last happen?15 years If all above answers are NO, may proceed with cephalosporin use.    Objective: Physical Exam  General: Well developed, nourished, no acute  distress, awake, alert and oriented x 3  Vascular: Dorsalis pedis artery 2/4 bilateral, Posterior tibial artery 1/4 bilateral, skin temperature warm to warm proximal to distal bilateral lower extremities, mild varicosities, pedal hair present bilateral.  Neurological: Gross sensation present via light touch bilateral.   Dermatological: Skin is warm, dry, and supple bilateral, Nails 1-10 are tender, long, thick, and discolored with mild subungal debris with lifting at bilateral hallux, no webspace macerations present bilateral, no open lesions present bilateral, no callus/corns/hyperkeratotic tissue present bilateral. No signs of infection bilateral.  Musculoskeletal: Minimal pain to palpation to right midfoot with limited motion likely consistent with arthritis.  There is no pain to palpation to right fifth toe with mild digital contracture and soft tissue swelling from history of previous injury.   Assessment and Plan:  Problem List Items Addressed This Visit    None    Visit Diagnoses    Nail dystrophy    -  Primary   PVD (peripheral vascular disease) (HCC)       Arthritis of midtarsal joint of right foot       Tendonitis          -Examined patient.  -Discussed treatment options for painful mycotic nails. -Mechanically debrided and reduced mycotic nails with sterile nail nipper and dremel nail file without incident. -Recommend tea tree oil and vinegar soaking -Advised patient to try over-the-counter Voltaren to the right foot and gentle stretching -Recommended good supportive shoes and protection  of fifth toe to prevent reinjury -Patient to return in 3 months for follow up evaluation or sooner if symptoms worsen.  Asencion Islam, DPM

## 2019-12-08 NOTE — Patient Instructions (Addendum)
Vinegar soaks 1 cup of white distilled vinegar to 8 cups of warm water.  Soak 20 mins. May repeat soak two times per week.  If there is thickness to nails may file nails after soaks or after bath/shower with nail file and apply tea tree oil. Apply oil daily to nails after filing for the best result.  VOLTAREN GEL TO TOP OF RIGHT FOOT AS NEEDED FOR PAIN

## 2020-02-03 DIAGNOSIS — Z6825 Body mass index (BMI) 25.0-25.9, adult: Secondary | ICD-10-CM | POA: Diagnosis not present

## 2020-02-03 DIAGNOSIS — Z1231 Encounter for screening mammogram for malignant neoplasm of breast: Secondary | ICD-10-CM | POA: Diagnosis not present

## 2020-02-03 DIAGNOSIS — Z01419 Encounter for gynecological examination (general) (routine) without abnormal findings: Secondary | ICD-10-CM | POA: Diagnosis not present

## 2020-02-08 ENCOUNTER — Encounter: Payer: Self-pay | Admitting: Cardiology

## 2020-02-08 ENCOUNTER — Other Ambulatory Visit: Payer: Self-pay

## 2020-02-08 ENCOUNTER — Ambulatory Visit (INDEPENDENT_AMBULATORY_CARE_PROVIDER_SITE_OTHER): Payer: BLUE CROSS/BLUE SHIELD | Admitting: Cardiology

## 2020-02-08 VITALS — BP 124/76 | HR 64 | Ht 63.0 in | Wt 147.6 lb

## 2020-02-08 DIAGNOSIS — I480 Paroxysmal atrial fibrillation: Secondary | ICD-10-CM | POA: Diagnosis not present

## 2020-02-08 NOTE — Patient Instructions (Addendum)
Medication Instructions:  °Your physician recommends that you continue on your current medications as directed. Please refer to the Current Medication list given to you today. ° °*If you need a refill on your cardiac medications before your next appointment, please call your pharmacy* ° ° °Lab Work: °None ordered ° ° °Testing/Procedures: °None ordered ° ° °Follow-Up: °At [facility], you and your health needs are our priority.  As part of our continuing mission to provide you with exceptional heart care, we have created designated Provider Care Teams.  These Care Teams include your primary Cardiologist (physician) and Advanced Practice Providers (APPs -  Physician Assistants and Nurse Practitioners) who all work together to provide you with the care you need, when you need it. ° °Your next appointment:   °1 year(s) ° °The format for your next appointment:   °In Person ° °Provider:   °Will Camnitz, MD ° ° ° °Thank you for choosing [facility]!! ° ° °Brocha Gilliam, RN °[phone] ° ° ° °

## 2020-02-08 NOTE — Progress Notes (Signed)
Electrophysiology Office Note   Date:  02/08/2020   ID:  LASHANE WHELPLEY, DOB Mar 10, 1956, MRN [id number]  PCP:  Noni Saupe, MD  Cardiologist:   Primary Electrophysiologist:  Shakera Ebrahimi Jorja Loa, MD    No chief complaint on file.    History of Present Illness: Madeline Mcmillan is a 64 y.o. female who is being seen today for the evaluation of atrial fibrillation at the request of Noni Saupe, MD. Presenting today for electrophysiology evaluation.  She has a history of hyperlipidemia and atrial fibrillation.  February 2018 she was walking and felt shaky with shortness of breath.  Her symptoms persisted and she went to a local hospital diagnosed with rapid atrial fibrillation.  She was put on diltiazem and spontaneously converted to sinus rhythm.  She underwent stress testing which showed a normal perfusion and a normal echo.  She was thus started on as needed flecainide.  Today, denies symptoms of palpitations, chest pain, shortness of breath, orthopnea, PND, lower extremity edema, claudication, dizziness, presyncope, syncope, bleeding, or neurologic sequela. The patient is tolerating medications without difficulties.  Since last being seen she has done well.  She has no chest pain or shortness of breath and is able to do all of her daily activities without restriction.  She was diagnosed with Covid last winter, and has slowly been improving.  She does state that she continues to have issues with smell.   Past Medical History:  Diagnosis Date  . Atrial fibrillation (HCC) 05/2016  . Hyperlipidemia 2014  . Migraine headache   . Pain in left hip    POSTERIOR AND LATERAL   . Palpitations    Past Surgical History:  Procedure Laterality Date  . BACK SURGERY  2002  . CESAREAN SECTION  1989, 1991  . FOOT SURGERY  2007  . TONSILLECTOMY  1965     Current Outpatient Medications  Medication Sig Dispense Refill  . atorvastatin (LIPITOR) 10 MG tablet Take 10 mg by mouth  at bedtime.     Marland Kitchen CALCIUM PO Take by mouth.    . diltiazem (CARDIZEM) 30 MG tablet Take 30 mg by mouth 4 (four) times daily as needed.    . flecainide (TAMBOCOR) 100 MG tablet Take 100 mg by mouth as needed.    Marland Kitchen MAGNESIUM PO Take 1 tablet by mouth daily.     No current facility-administered medications for this visit.    Allergies:   Amoxicillin and Penicillins   Social History:  The patient  reports that she has never smoked. She has never used smokeless tobacco. She reports that she does not drink alcohol and does not use drugs.   Family History:  The patient's family history includes CAD in her mother; Cerebral aneurysm in her brother; Healthy in her sister; Other in her father.    ROS:  Please see the history of present illness.   Otherwise, review of systems is positive for none.   All other systems are reviewed and negative.   PHYSICAL EXAM: VS:  BP 124/76   Pulse 64   Ht 5\' 3"  (1.6 m)   Wt 147 lb 9.6 oz (67 kg)   SpO2 100%   BMI 26.15 kg/m  , BMI Body mass index is 26.15 kg/m. GEN: Well nourished, well developed, in no acute distress  HEENT: normal  Neck: no JVD, carotid bruits, or masses Cardiac: RRR; no murmurs, rubs, or gallops,no edema  Respiratory:  clear to auscultation bilaterally, normal work  of breathing GI: soft, nontender, nondistended, + BS MS: no deformity or atrophy  Skin: warm and dry Neuro:  Strength and sensation are intact Psych: euthymic mood, full affect  EKG:  EKG is ordered today. Personal review of the ekg ordered shows sinus rhythm, rate 64  Recent Labs: 09/24/2019: BUN 10; Creatinine, Ser 0.71; Hemoglobin 12.8; Platelets 179; Potassium 3.6; Sodium 140    Lipid Panel  No results found for: CHOL, TRIG, HDL, CHOLHDL, VLDL, LDLCALC, LDLDIRECT   Wt Readings from Last 3 Encounters:  02/08/20 147 lb 9.6 oz (67 kg)  09/24/19 141 lb (64 kg)  07/20/19 142 lb (64.4 kg)      Other studies Reviewed: Additional studies/ records that were  reviewed today include: ETT 12/25/17  Review of the above records today demonstrates:  BASELINE EKG NORMAL AT 74/MIN.  PT EXERCISED 7 MIN, BRUCE AT 8 METS, ABOVE AVERAGE FUNCTIONAL CAPACITY FOR AGE. ACHIEVED HR 136/MIN 86% MPHR. NO ANGINA OR ST ABNORMALITY RARE PAC/PVC.  TTE 08/03/2019 1. Left ventricular ejection fraction, by estimation, is 60 to 65%. The  left ventricle has normal function. The left ventricle has no regional  wall motion abnormalities. Left ventricular diastolic parameters are  consistent with Grade I diastolic  dysfunction (impaired relaxation).  2. Right ventricular systolic function is normal. The right ventricular  size is normal. There is normal pulmonary artery systolic pressure.  3. The mitral valve is normal in structure. No evidence of mitral valve  regurgitation. No evidence of mitral stenosis.   ASSESSMENT AND PLAN:  1.  Paroxysmal atrial fibrillation: Currently diagnosed in [place].  CHA2DS2-VASc of 1.  On flecainide, monitoring for high risk medication, and diltiazem.  She takes these as needed.  She has had minimal episodes of atrial fibrillation.  She has done well overall.  We Eman Rynders continue with current management.   Current medicines are reviewed at length with the patient today.   The patient does not have concerns regarding her medicines.  The following changes were made today: None  Labs/ tests ordered today include:  Orders Placed This Encounter  Procedures  . EKG 12-Lead     Disposition:   FU with [facility] 12 months  Signed, Hassen Bruun Jorja Loa, MD  02/08/2020 10:08 AM     [facility] [address] [place] [address] [phone] (office) [phone] (fax)

## 2020-02-15 ENCOUNTER — Other Ambulatory Visit: Payer: Self-pay | Admitting: Obstetrics and Gynecology

## 2020-02-15 ENCOUNTER — Other Ambulatory Visit (HOSPITAL_COMMUNITY): Payer: Self-pay | Admitting: Obstetrics and Gynecology

## 2020-02-15 DIAGNOSIS — R87619 Unspecified abnormal cytological findings in specimens from cervix uteri: Secondary | ICD-10-CM | POA: Diagnosis not present

## 2020-02-15 DIAGNOSIS — R87610 Atypical squamous cells of undetermined significance on cytologic smear of cervix (ASC-US): Secondary | ICD-10-CM

## 2020-02-15 DIAGNOSIS — Z6825 Body mass index (BMI) 25.0-25.9, adult: Secondary | ICD-10-CM | POA: Diagnosis not present

## 2020-02-15 DIAGNOSIS — N950 Postmenopausal bleeding: Secondary | ICD-10-CM

## 2020-02-16 ENCOUNTER — Ambulatory Visit (HOSPITAL_COMMUNITY)
Admission: RE | Admit: 2020-02-16 | Discharge: 2020-02-16 | Disposition: A | Discharge status: Home / Self Care | Payer: BLUE CROSS/BLUE SHIELD | Source: Ambulatory Visit | Attending: Obstetrics and Gynecology | Admitting: Obstetrics and Gynecology

## 2020-02-16 ENCOUNTER — Other Ambulatory Visit: Payer: Self-pay

## 2020-02-16 ENCOUNTER — Other Ambulatory Visit (HOSPITAL_COMMUNITY): Payer: Self-pay | Admitting: Obstetrics and Gynecology

## 2020-02-16 DIAGNOSIS — D252 Subserosal leiomyoma of uterus: Secondary | ICD-10-CM | POA: Diagnosis not present

## 2020-02-16 DIAGNOSIS — N950 Postmenopausal bleeding: Secondary | ICD-10-CM | POA: Diagnosis not present

## 2020-02-16 DIAGNOSIS — R87610 Atypical squamous cells of undetermined significance on cytologic smear of cervix (ASC-US): Secondary | ICD-10-CM

## 2020-02-16 DIAGNOSIS — D250 Submucous leiomyoma of uterus: Secondary | ICD-10-CM | POA: Diagnosis not present

## 2020-02-18 ENCOUNTER — Ambulatory Visit (HOSPITAL_COMMUNITY): Payer: BLUE CROSS/BLUE SHIELD

## 2020-03-08 ENCOUNTER — Ambulatory Visit: Payer: BLUE CROSS/BLUE SHIELD | Admitting: Sports Medicine

## 2020-03-28 DIAGNOSIS — Z23 Encounter for immunization: Secondary | ICD-10-CM | POA: Diagnosis not present

## 2020-04-13 ENCOUNTER — Ambulatory Visit: Payer: BC Managed Care – PPO | Admitting: Sports Medicine

## 2020-04-13 ENCOUNTER — Other Ambulatory Visit: Payer: Self-pay

## 2020-04-13 ENCOUNTER — Encounter: Payer: Self-pay | Admitting: Sports Medicine

## 2020-04-13 DIAGNOSIS — I739 Peripheral vascular disease, unspecified: Secondary | ICD-10-CM | POA: Diagnosis not present

## 2020-04-13 DIAGNOSIS — L603 Nail dystrophy: Secondary | ICD-10-CM | POA: Diagnosis not present

## 2020-04-13 DIAGNOSIS — M19071 Primary osteoarthritis, right ankle and foot: Secondary | ICD-10-CM | POA: Diagnosis not present

## 2020-04-13 DIAGNOSIS — L989 Disorder of the skin and subcutaneous tissue, unspecified: Secondary | ICD-10-CM

## 2020-04-13 DIAGNOSIS — M779 Enthesopathy, unspecified: Secondary | ICD-10-CM | POA: Diagnosis not present

## 2020-04-13 NOTE — Progress Notes (Signed)
Subjective: Madeline Mcmillan is a 65 y.o. female patient seen today for follow-up evaluation of thickness and discoloration to bilateral hallux toenails.  Patient also reports that she has a small lesion at the top of her right foot that showed up a few days ago does not itch burn and sting swell or drain.  Patient is unsure of the cause of this lesion.  Patient also states that there is still some pain at her midfoot and also has noticed a small raised bump on the lateral foot as well that is nonpainful.  Patient denies any constitutional symptoms at this time.  Patient denies any injury.   Patient Active Problem List   Diagnosis Date Noted  . Encounter for screening colonoscopy 02/19/2019  . Other fatigue 12/12/2017  . Anxiety 12/11/2016  . Bradycardia 09/04/2016  . Atrial fibrillation (HCC) 08/29/2016  . Migraine 08/29/2016  . On anticoagulant therapy 08/29/2016    Current Outpatient Medications on File Prior to Visit  Medication Sig Dispense Refill  . atorvastatin (LIPITOR) 10 MG tablet Take 10 mg by mouth at bedtime.     Marland Kitchen CALCIUM PO Take by mouth.    . diltiazem (CARDIZEM) 30 MG tablet Take 30 mg by mouth 4 (four) times daily as needed.    . flecainide (TAMBOCOR) 100 MG tablet Take 100 mg by mouth as needed.    Marland Kitchen MAGNESIUM PO Take 1 tablet by mouth daily.     No current facility-administered medications on file prior to visit.    Allergies  Allergen Reactions  . Amoxicillin Rash  . Penicillins Rash    Did it involve swelling of the face/tongue/throat, SOB, or low BP? No Did it involve sudden or severe rash/hives, skin peeling, or any reaction on the inside of your mouth or nose? No Did you need to seek medical attention at a hospital or doctor's office? No When did it last happen?15 years If all above answers are "NO", may proceed with cephalosporin use.    Objective: Physical Exam  General: Well developed, nourished, no acute distress, awake, alert and oriented x  3  Vascular: Dorsalis pedis artery 2/4 bilateral, Posterior tibial artery 1/4 bilateral, skin temperature warm to warm proximal to distal bilateral lower extremities, mild varicosities, pedal hair present bilateral.  Neurological: Gross sensation present via light touch bilateral.   Dermatological: Skin is warm, dry, and supple bilateral, Nails 1-10 are tender, long, thick, and discolored with mild subungal debris at bilateral hallux, there is a flesh-colored lesion that measures less than 1 mm at the dorsal midfoot with a small dry keratotic scab once removed healthy granular base with no drainage likely this lesion is consistent with abrasion versus skin pimple.  There is also a small pea-sized movable soft tissue mass noted at the dorsal lateral foot likely consistent with cyst on the right foot.  No webspace macerations present bilateral, no open lesions present bilateral, no callus/corns/hyperkeratotic tissue present bilateral. No signs of infection bilateral.  Musculoskeletal: Minimal pain to palpation to right midfoot with limited motion likely consistent with arthritis.  Range of motion acceptable for patient age.   Assessment and Plan:  Problem List Items Addressed This Visit   None   Visit Diagnoses    Nail dystrophy    -  Primary   PVD (peripheral vascular disease) (HCC)       Arthritis of midtarsal joint of right foot       Tendonitis       Skin lesion of foot          -  Examined patient.  -Discussed treatment options for dystrophic nail -Advised patient to continue with tea tree oil and vinegar soaking if fails to improve offered patient to try Tolcylen -Advised patient to continue to use over-the-counter topical pain creams and rubs in good supportive shoes for pain at right midfoot -Advised patient if pain becomes more consistent should consider injection at the right midfoot -Discussed with patient importance of good supportive shoes daily for foot type -At no additional  charge using a 15 blade remove keratotic scab at the right dorsal midfoot lesion no pus or signs of infection noted.  Applied antibiotic cream and Band-Aid dressing and advised patient to do the same daily to allow this area to slowly heal if there are problems or drainage to return to office for reevaluation -Advised patient that we will slowly monitor suspected cyst or soft tissue mass at the right dorsal lateral foot -Patient to return as needed or sooner if symptoms worsen.  Asencion Islam, DPM

## 2020-05-11 DIAGNOSIS — Z6825 Body mass index (BMI) 25.0-25.9, adult: Secondary | ICD-10-CM | POA: Diagnosis not present

## 2020-05-11 DIAGNOSIS — R87619 Unspecified abnormal cytological findings in specimens from cervix uteri: Secondary | ICD-10-CM | POA: Diagnosis not present

## 2020-05-11 DIAGNOSIS — R87610 Atypical squamous cells of undetermined significance on cytologic smear of cervix (ASC-US): Secondary | ICD-10-CM | POA: Diagnosis not present

## 2020-05-25 DIAGNOSIS — E785 Hyperlipidemia, unspecified: Secondary | ICD-10-CM | POA: Diagnosis not present

## 2020-05-25 DIAGNOSIS — M79651 Pain in right thigh: Secondary | ICD-10-CM | POA: Diagnosis not present

## 2020-05-25 DIAGNOSIS — M4727 Other spondylosis with radiculopathy, lumbosacral region: Secondary | ICD-10-CM | POA: Diagnosis not present

## 2020-05-25 DIAGNOSIS — M79652 Pain in left thigh: Secondary | ICD-10-CM | POA: Diagnosis not present

## 2020-05-25 DIAGNOSIS — K5909 Other constipation: Secondary | ICD-10-CM | POA: Diagnosis not present

## 2020-07-05 DIAGNOSIS — L820 Inflamed seborrheic keratosis: Secondary | ICD-10-CM | POA: Diagnosis not present

## 2020-07-06 ENCOUNTER — Other Ambulatory Visit: Payer: Self-pay

## 2020-07-06 DIAGNOSIS — R911 Solitary pulmonary nodule: Secondary | ICD-10-CM | POA: Diagnosis not present

## 2020-07-06 DIAGNOSIS — J9811 Atelectasis: Secondary | ICD-10-CM | POA: Diagnosis not present

## 2020-07-06 DIAGNOSIS — R918 Other nonspecific abnormal finding of lung field: Secondary | ICD-10-CM | POA: Diagnosis not present

## 2020-07-06 DIAGNOSIS — I700 Atherosclerosis of aorta: Secondary | ICD-10-CM | POA: Diagnosis not present

## 2020-07-06 MED ORDER — FLECAINIDE ACETATE 100 MG PO TABS: 100.0000 mg | ORAL_TABLET | ORAL | 2 refills | Status: DC | PRN

## 2020-07-06 MED ORDER — DILTIAZEM HCL 30 MG PO TABS: 30.0000 mg | ORAL_TABLET | Freq: Four times a day (QID) | ORAL | 2 refills | Status: DC | PRN

## 2020-07-08 DIAGNOSIS — R5383 Other fatigue: Secondary | ICD-10-CM | POA: Diagnosis not present

## 2020-07-08 DIAGNOSIS — R5381 Other malaise: Secondary | ICD-10-CM | POA: Diagnosis not present

## 2020-07-08 DIAGNOSIS — M5432 Sciatica, left side: Secondary | ICD-10-CM | POA: Diagnosis not present

## 2020-07-08 DIAGNOSIS — Z6825 Body mass index (BMI) 25.0-25.9, adult: Secondary | ICD-10-CM | POA: Diagnosis not present

## 2020-09-08 DIAGNOSIS — Z6825 Body mass index (BMI) 25.0-25.9, adult: Secondary | ICD-10-CM | POA: Diagnosis not present

## 2020-09-08 DIAGNOSIS — M542 Cervicalgia: Secondary | ICD-10-CM | POA: Diagnosis not present

## 2020-09-08 DIAGNOSIS — I4891 Unspecified atrial fibrillation: Secondary | ICD-10-CM | POA: Diagnosis not present

## 2020-09-08 DIAGNOSIS — R42 Dizziness and giddiness: Secondary | ICD-10-CM | POA: Diagnosis not present

## 2020-09-12 DIAGNOSIS — Z1211 Encounter for screening for malignant neoplasm of colon: Secondary | ICD-10-CM | POA: Diagnosis not present

## 2020-09-12 DIAGNOSIS — K5900 Constipation, unspecified: Secondary | ICD-10-CM | POA: Diagnosis not present

## 2020-09-12 DIAGNOSIS — K625 Hemorrhage of anus and rectum: Secondary | ICD-10-CM | POA: Diagnosis not present

## 2020-09-12 DIAGNOSIS — K648 Other hemorrhoids: Secondary | ICD-10-CM | POA: Diagnosis not present

## 2020-09-27 DIAGNOSIS — M4802 Spinal stenosis, cervical region: Secondary | ICD-10-CM

## 2020-09-27 DIAGNOSIS — Z6826 Body mass index (BMI) 26.0-26.9, adult: Secondary | ICD-10-CM

## 2020-09-27 HISTORY — DX: Body mass index (BMI) 26.0-26.9, adult: Z68.26

## 2020-09-27 HISTORY — DX: Spinal stenosis, cervical region: M48.02

## 2020-10-13 DIAGNOSIS — J40 Bronchitis, not specified as acute or chronic: Secondary | ICD-10-CM | POA: Diagnosis not present

## 2020-10-13 DIAGNOSIS — J302 Other seasonal allergic rhinitis: Secondary | ICD-10-CM | POA: Diagnosis not present

## 2020-10-13 DIAGNOSIS — J329 Chronic sinusitis, unspecified: Secondary | ICD-10-CM | POA: Diagnosis not present

## 2020-10-28 ENCOUNTER — Other Ambulatory Visit: Payer: Self-pay | Admitting: Physician Assistant

## 2020-10-28 DIAGNOSIS — R1010 Upper abdominal pain, unspecified: Secondary | ICD-10-CM | POA: Diagnosis not present

## 2020-10-28 DIAGNOSIS — R1011 Right upper quadrant pain: Secondary | ICD-10-CM | POA: Diagnosis not present

## 2020-10-28 DIAGNOSIS — K648 Other hemorrhoids: Secondary | ICD-10-CM | POA: Diagnosis not present

## 2020-10-28 DIAGNOSIS — K5900 Constipation, unspecified: Secondary | ICD-10-CM | POA: Diagnosis not present

## 2020-10-31 DIAGNOSIS — M542 Cervicalgia: Secondary | ICD-10-CM | POA: Diagnosis not present

## 2020-11-04 ENCOUNTER — Other Ambulatory Visit: Payer: Self-pay

## 2020-11-04 ENCOUNTER — Ambulatory Visit
Admission: RE | Admit: 2020-11-04 | Discharge: 2020-11-04 | Disposition: A | Discharge status: Home / Self Care | Payer: BC Managed Care – PPO | Source: Ambulatory Visit | Attending: Physician Assistant | Admitting: Physician Assistant

## 2020-11-04 DIAGNOSIS — Z981 Arthrodesis status: Secondary | ICD-10-CM | POA: Diagnosis not present

## 2020-11-04 DIAGNOSIS — N281 Cyst of kidney, acquired: Secondary | ICD-10-CM | POA: Diagnosis not present

## 2020-11-04 DIAGNOSIS — R1010 Upper abdominal pain, unspecified: Secondary | ICD-10-CM

## 2020-11-04 DIAGNOSIS — I700 Atherosclerosis of aorta: Secondary | ICD-10-CM | POA: Diagnosis not present

## 2020-11-04 MED ORDER — IOPAMIDOL (ISOVUE-300) INJECTION 61%
100.0000 mL | Freq: Once | INTRAVENOUS | Status: AC | PRN
  Administered 2020-11-04: 100 mL via INTRAVENOUS

## 2020-11-08 DIAGNOSIS — M542 Cervicalgia: Secondary | ICD-10-CM | POA: Diagnosis not present

## 2020-11-16 DIAGNOSIS — M542 Cervicalgia: Secondary | ICD-10-CM | POA: Diagnosis not present

## 2020-11-17 DIAGNOSIS — M48062 Spinal stenosis, lumbar region with neurogenic claudication: Secondary | ICD-10-CM | POA: Diagnosis not present

## 2020-11-17 DIAGNOSIS — R030 Elevated blood-pressure reading, without diagnosis of hypertension: Secondary | ICD-10-CM | POA: Diagnosis not present

## 2020-11-17 DIAGNOSIS — M546 Pain in thoracic spine: Secondary | ICD-10-CM

## 2020-11-17 DIAGNOSIS — M4802 Spinal stenosis, cervical region: Secondary | ICD-10-CM | POA: Diagnosis not present

## 2020-11-17 HISTORY — DX: Pain in thoracic spine: M54.6

## 2020-11-18 DIAGNOSIS — M47814 Spondylosis without myelopathy or radiculopathy, thoracic region: Secondary | ICD-10-CM

## 2020-11-18 DIAGNOSIS — M47812 Spondylosis without myelopathy or radiculopathy, cervical region: Secondary | ICD-10-CM

## 2020-11-18 DIAGNOSIS — M4722 Other spondylosis with radiculopathy, cervical region: Secondary | ICD-10-CM | POA: Insufficient documentation

## 2020-11-18 HISTORY — DX: Spondylosis without myelopathy or radiculopathy, thoracic region: M47.814

## 2020-11-18 HISTORY — DX: Spondylosis without myelopathy or radiculopathy, cervical region: M47.812

## 2020-11-22 ENCOUNTER — Other Ambulatory Visit: Payer: Self-pay | Admitting: Student

## 2020-11-22 DIAGNOSIS — M4802 Spinal stenosis, cervical region: Secondary | ICD-10-CM

## 2020-11-22 DIAGNOSIS — M546 Pain in thoracic spine: Secondary | ICD-10-CM

## 2020-11-27 IMAGING — DX DG CHEST 1V PORT
1 series · 1 of 1 positions shown · non-contrast
Comparison: Portable exam 9277 hours compared to 07/19/2016

CLINICAL DATA: COVID pneumonia, was feeling better but now has
fever and shortness of breath

EXAM:
PORTABLE CHEST 1 VIEW

[chest ap]
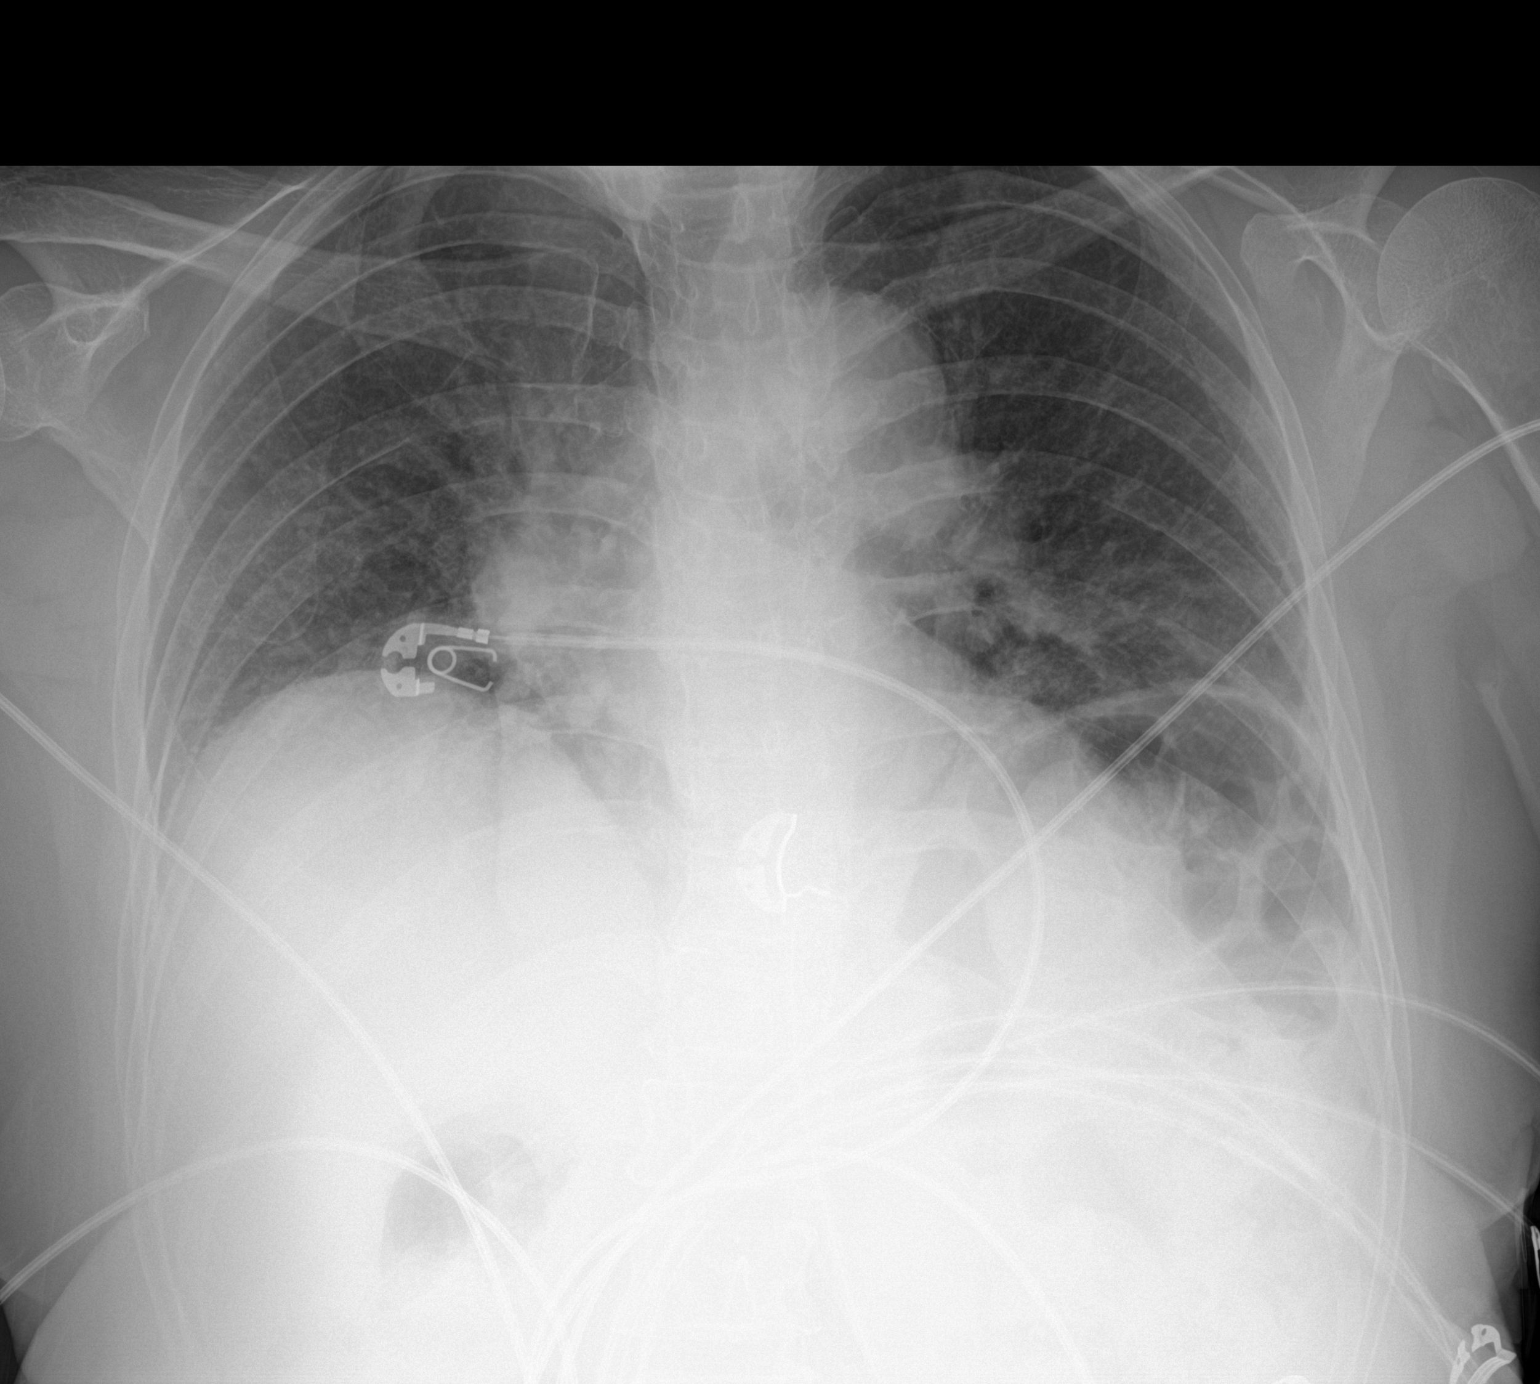

[1 of 1 positions shown; findings below may reference images not displayed]

FINDINGS: Upper normal heart size.

Mediastinal contours and pulmonary vascularity normal.

Mild opacities at the lung bases question multifocal pneumonia
versus atelectasis.

Upper lungs clear.

No pleural effusion or pneumothorax.

Bones unremarkable.
IMPRESSION: Mild bibasilar opacities question multifocal pneumonia versus
atelectasis.

## 2020-12-10 ENCOUNTER — Other Ambulatory Visit: Payer: Self-pay

## 2020-12-10 ENCOUNTER — Ambulatory Visit
Admission: RE | Admit: 2020-12-10 | Discharge: 2020-12-10 | Disposition: A | Discharge status: Home / Self Care | Payer: BC Managed Care – PPO | Source: Ambulatory Visit | Attending: Student | Admitting: Student

## 2020-12-10 DIAGNOSIS — M5124 Other intervertebral disc displacement, thoracic region: Secondary | ICD-10-CM | POA: Diagnosis not present

## 2020-12-10 DIAGNOSIS — M4802 Spinal stenosis, cervical region: Secondary | ICD-10-CM

## 2020-12-10 DIAGNOSIS — M546 Pain in thoracic spine: Secondary | ICD-10-CM

## 2020-12-12 ENCOUNTER — Other Ambulatory Visit: Payer: BC Managed Care – PPO

## 2020-12-14 DIAGNOSIS — H5213 Myopia, bilateral: Secondary | ICD-10-CM | POA: Diagnosis not present

## 2020-12-20 ENCOUNTER — Ambulatory Visit (INDEPENDENT_AMBULATORY_CARE_PROVIDER_SITE_OTHER): Payer: BC Managed Care – PPO | Admitting: Otolaryngology

## 2020-12-20 ENCOUNTER — Other Ambulatory Visit: Payer: Self-pay

## 2020-12-20 DIAGNOSIS — R42 Dizziness and giddiness: Secondary | ICD-10-CM | POA: Diagnosis not present

## 2020-12-20 DIAGNOSIS — H903 Sensorineural hearing loss, bilateral: Secondary | ICD-10-CM

## 2020-12-20 NOTE — Progress Notes (Signed)
HPI: Madeline Mcmillan is a 65 y.o. female who presents is referred by her PCP for evaluation of dizziness.  She was referred a couple months ago because of episodes of dizziness.  This was occurring while she was active and she described more of an imbalance than actual vertigo or spinning sensation..  She has not noted any hearing loss although she does notice some congestion in her ears and feels like she hears better when she "pop" her ears.  She is having no difficulty breathing presently.  She is doing much better today that she was back in July.  Past Medical History:  Diagnosis Date   Atrial fibrillation (HCC) 05/2016   Hyperlipidemia 2014   Migraine headache    Pain in left hip    POSTERIOR AND LATERAL    Palpitations    Past Surgical History:  Procedure Laterality Date   BACK SURGERY  2002   CESAREAN SECTION  1989, 1991   FOOT SURGERY  2007   TONSILLECTOMY  1965   Social History   Socioeconomic History   Marital status: Married    Spouse name: Not on file   Number of children: Not on file   Years of education: Not on file   Highest education level: Not on file  Occupational History   Not on file  Tobacco Use   Smoking status: Never   Smokeless tobacco: Never  Substance and Sexual Activity   Alcohol use: Never   Drug use: Never   Sexual activity: Not on file    Comment: MARRIED  Other Topics Concern   Not on file  Social History Narrative   Not on file   Social Determinants of Health   Financial Resource Strain: Not on file  Food Insecurity: Not on file  Transportation Needs: Not on file  Physical Activity: Not on file  Stress: Not on file  Social Connections: Not on file   Family History  Problem Relation Age of Onset   CAD Mother    Other Father        BACK SURGERY, HIP REPLACEMENT   Healthy Sister    Cerebral aneurysm Brother    Allergies  Allergen Reactions   Amoxicillin Rash   Penicillins Rash    Did it involve swelling of the  face/tongue/throat, SOB, or low BP? No Did it involve sudden or severe rash/hives, skin peeling, or any reaction on the inside of your mouth or nose? No Did you need to seek medical attention at a hospital or doctor's office? No When did it last happen?      15 years If all above answers are "NO", may proceed with cephalosporin use.   Prior to Admission medications   Medication Sig Start Date End Date Taking? Authorizing Provider  atorvastatin (LIPITOR) 10 MG tablet Take 10 mg by mouth at bedtime.  03/18/19   [provider]  CALCIUM PO Take by mouth.    [provider]  diltiazem (CARDIZEM) 30 MG tablet Take 1 tablet (30 mg total) by mouth 4 (four) times daily as needed. 07/06/20   Camnitz, Andree Coss, MD  flecainide (TAMBOCOR) 100 MG tablet Take 1 tablet (100 mg total) by mouth as needed. 07/06/20   Camnitz, Andree Coss, MD  MAGNESIUM PO Take 1 tablet by mouth daily.    [provider]     Positive ROS: Otherwise negative  All other systems have been reviewed and were otherwise negative with the exception of those mentioned in the HPI  and as above.  Physical Exam: Constitutional: Alert, well-appearing, no acute distress Ears: External ears without lesions or tenderness. Ear canals are clear bilaterally with intact, clear TMs bilaterally.  Hearing screening with the tuning forks revealed moderate subjective sensorineural hearing loss in both ears with a 1024 tuning fork. Nasal: External nose without lesions. Septum with minimal deformity and clear nasal passages bilaterally with mild rhinitis..  Oral: Lips and gums without lesions. Tongue and palate mucosa without lesions. Posterior oropharynx clear. Neck: No palpable adenopathy or masses Respiratory: Breathing comfortably  Skin: No facial/neck lesions or rash noted.  Procedures  Assessment: Dizziness presently doing much better. Underlying sensorineural hearing loss in both ears. No evidence of  BPPV.  Plan: Reviewed with her concerning her hearing loss and that she might benefit by obtaining audiologic testing and hearing aids if indicated. Discussed with her that there is no evidence of inner ear problems causing her dizziness.   Narda Bonds, MD   CC:

## 2020-12-28 DIAGNOSIS — I10 Essential (primary) hypertension: Secondary | ICD-10-CM | POA: Insufficient documentation

## 2020-12-28 DIAGNOSIS — M4722 Other spondylosis with radiculopathy, cervical region: Secondary | ICD-10-CM | POA: Diagnosis not present

## 2021-01-04 DIAGNOSIS — J309 Allergic rhinitis, unspecified: Secondary | ICD-10-CM | POA: Diagnosis not present

## 2021-01-04 DIAGNOSIS — Z20828 Contact with and (suspected) exposure to other viral communicable diseases: Secondary | ICD-10-CM | POA: Diagnosis not present

## 2021-01-19 DIAGNOSIS — R5383 Other fatigue: Secondary | ICD-10-CM | POA: Diagnosis not present

## 2021-01-19 DIAGNOSIS — R5381 Other malaise: Secondary | ICD-10-CM | POA: Diagnosis not present

## 2021-01-19 DIAGNOSIS — M5134 Other intervertebral disc degeneration, thoracic region: Secondary | ICD-10-CM | POA: Diagnosis not present

## 2021-01-19 DIAGNOSIS — Z6825 Body mass index (BMI) 25.0-25.9, adult: Secondary | ICD-10-CM | POA: Diagnosis not present

## 2021-01-19 DIAGNOSIS — Z1331 Encounter for screening for depression: Secondary | ICD-10-CM | POA: Diagnosis not present

## 2021-01-19 DIAGNOSIS — R030 Elevated blood-pressure reading, without diagnosis of hypertension: Secondary | ICD-10-CM | POA: Diagnosis not present

## 2021-02-13 DIAGNOSIS — Z01419 Encounter for gynecological examination (general) (routine) without abnormal findings: Secondary | ICD-10-CM | POA: Diagnosis not present

## 2021-02-13 DIAGNOSIS — R87619 Unspecified abnormal cytological findings in specimens from cervix uteri: Secondary | ICD-10-CM | POA: Diagnosis not present

## 2021-02-13 DIAGNOSIS — Z1231 Encounter for screening mammogram for malignant neoplasm of breast: Secondary | ICD-10-CM | POA: Diagnosis not present

## 2021-02-13 DIAGNOSIS — Z6826 Body mass index (BMI) 26.0-26.9, adult: Secondary | ICD-10-CM | POA: Diagnosis not present

## 2021-02-21 ENCOUNTER — Other Ambulatory Visit: Payer: Self-pay

## 2021-02-21 DIAGNOSIS — R002 Palpitations: Secondary | ICD-10-CM | POA: Insufficient documentation

## 2021-02-21 DIAGNOSIS — A46 Erysipelas: Secondary | ICD-10-CM | POA: Insufficient documentation

## 2021-02-21 DIAGNOSIS — M25552 Pain in left hip: Secondary | ICD-10-CM | POA: Insufficient documentation

## 2021-02-21 DIAGNOSIS — R040 Epistaxis: Secondary | ICD-10-CM | POA: Insufficient documentation

## 2021-02-21 HISTORY — DX: Erysipelas: A46

## 2021-02-21 HISTORY — DX: Epistaxis: R04.0

## 2021-02-22 ENCOUNTER — Encounter: Payer: Self-pay | Admitting: Cardiology

## 2021-02-22 ENCOUNTER — Other Ambulatory Visit: Payer: Self-pay

## 2021-02-22 ENCOUNTER — Ambulatory Visit: Payer: BC Managed Care – PPO | Admitting: Cardiology

## 2021-02-22 VITALS — BP 126/88 | HR 69 | Ht 63.0 in | Wt 148.8 lb

## 2021-02-22 DIAGNOSIS — K625 Hemorrhage of anus and rectum: Secondary | ICD-10-CM | POA: Insufficient documentation

## 2021-02-22 DIAGNOSIS — R1011 Right upper quadrant pain: Secondary | ICD-10-CM | POA: Insufficient documentation

## 2021-02-22 DIAGNOSIS — K648 Other hemorrhoids: Secondary | ICD-10-CM | POA: Insufficient documentation

## 2021-02-22 DIAGNOSIS — K5900 Constipation, unspecified: Secondary | ICD-10-CM | POA: Insufficient documentation

## 2021-02-22 DIAGNOSIS — I480 Paroxysmal atrial fibrillation: Secondary | ICD-10-CM

## 2021-02-22 NOTE — Progress Notes (Signed)
Electrophysiology Office Note   Date:  02/22/2021   ID:  Madeline SmackWendy B Ng, DOB 1956/02/24, MRN [id number]  PCP:  Noni Saupeedding, John F. II, MD  Cardiologist:   Primary Electrophysiologist:  Devlynn Knoff Jorja LoaMartin Kanda Deluna, MD    No chief complaint on file.    History of Present Illness: Madeline Mcmillan is a 65 y.o. female who is being seen today for the evaluation of atrial fibrillation at the request of Noni Saupeedding, John F. II, MD. Presenting today for electrophysiology evaluation.  She has a history significant for hyperlipidemia and atrial fibrillation.  February 2018 she was walking and felt shaky with shortness of breath.  Her symptoms persisted and she went to a local hospital and was diagnosed with atrial fibrillation.  She was started on diltiazem and converted to sinus rhythm.  She underwent stress testing which showed normal perfusion and had a normal echo.  She was thus started on flecainide.  Today, denies symptoms of palpitations, chest pain, shortness of breath, orthopnea, PND, lower extremity edema, claudication, dizziness, presyncope, syncope, bleeding, or neurologic sequela. The patient is tolerating medications without difficulties.  Since being seen she has done well.  She has had no chest pain or shortness of breath.  She has intermittent palpitations, though they are all short-lived.  She has not had any further episodes similar to when she required emergency room visits.  She is overall happy with her control.   Past Medical History:  Diagnosis Date   Acute thoracic back pain 11/17/2020   Anterior epistaxis 02/21/2021   Anxiety 12/11/2016   Last Assessment & Plan:  Formatting of this note might be different from the original. Anxiety at this time is playing a significant role in her issues.  I have discussed with her that she should consider speaking to her primary care physician and possibly consider placement on something for her anxiety.   Atrial fibrillation (HCC) 05/2016   Body mass  index (BMI) 26.0-26.9, adult 09/27/2020   Bradycardia 09/04/2016   Formatting of this note might be different from the original. A.  Exacerbated by beta-blocker.  Last Assessment & Plan:  Formatting of this note might be different from the original. Resting heart rate today is 47. I do suspect there is some degree of sick sinus syndrome.  She has noted a decrease in her exercise capacity and would recommend a plain GXT to assess heart rate response to exercise an   Cervical spondylosis 11/18/2020   Degenerative spondylolisthesis 03/03/2019   Elevated blood-pressure reading, without diagnosis of hypertension 12/03/2019   Encounter for screening colonoscopy 02/19/2019   Formatting of this note might be different from the original. 12/20 colon - no polyps Recommend screening exam in 12/30 - [facility]   Erysipelas 02/21/2021   Hyperlipidemia 2014   Lumbar radiculopathy 01/14/2019   Migraine 08/29/2016   On anticoagulant therapy 08/29/2016   Formatting of this note might be different from the original. A. CHADS2 score of 0. B. CHADS2VASC score of 1. C. Treated with Aspirin   Last Assessment & Plan:  Formatting of this note might be different from the original. Chads 2 vas score of 1.  She is currently treated with an aspirin.  I would recommend down titration to 162 mg daily.   Other fatigue 12/12/2017   Last Assessment & Plan:  Formatting of this note might be different from the original. Decreased exercise capacity.  Is unclear if this is due to chronotropic incompetence.  Yaeko Fazekas recommend GXT to assess heart rate  response to exercise.   Pain in left hip    POSTERIOR AND LATERAL    Palpitations    Spinal stenosis in cervical region 09/27/2020   Spinal stenosis of lumbar region 03/24/2019   Spondylolisthesis, site unspecified 10/28/2019   Thoracic spondylosis 11/18/2020   Past Surgical History:  Procedure Laterality Date   BACK SURGERY  2002   [place]  2007   TONSILLECTOMY   1965     Current Outpatient Medications  Medication Sig Dispense Refill   atorvastatin (LIPITOR) 10 MG tablet Take 10 mg by mouth at bedtime.      CALCIUM PO Take 1 capsule by mouth daily.     diltiazem (CARDIZEM) 30 MG tablet Take 30 mg by mouth 4 (four) times daily as needed (a fib).     flecainide (TAMBOCOR) 100 MG tablet Take 100 mg by mouth as needed for heart rate control.     MAGNESIUM PO Take 1 tablet by mouth daily.     No current facility-administered medications for this visit.    Allergies:   Amoxicillin and Penicillins   Social History:  The patient  reports that she has never smoked. She has never used smokeless tobacco. She reports that she does not drink alcohol and does not use drugs.   Family History:  The patient's family history includes CAD in her mother; Cerebral aneurysm in her brother; Healthy in her sister; Other in her father.   ROS:  Please see the history of present illness.   Otherwise, review of systems is positive for none.   All other systems are reviewed and negative.   PHYSICAL EXAM: VS:  BP 126/88   Pulse 69   Ht 5\' 3"  (1.6 m)   Wt 148 lb 12.8 oz (67.5 kg)   SpO2 99%   BMI 26.36 kg/m  , BMI Body mass index is 26.36 kg/m. GEN: Well nourished, well developed, in no acute distress  HEENT: normal  Neck: no JVD, carotid bruits, or masses Cardiac: RRR; no murmurs, rubs, or gallops,no edema  Respiratory:  clear to auscultation bilaterally, normal work of breathing GI: soft, nontender, nondistended, + BS MS: no deformity or atrophy  Skin: warm and dry Neuro:  Strength and sensation are intact Psych: euthymic mood, full affect  EKG:  EKG is ordered today. Personal review of the ekg ordered shows sinus rhythm, rate 69  Recent Labs: No results found for requested labs within last 8760 hours.    Lipid Panel  No results found for: CHOL, TRIG, HDL, CHOLHDL, VLDL, LDLCALC, LDLDIRECT   Wt Readings from Last 3 Encounters:  02/22/21 148 lb 12.8  oz (67.5 kg)  02/08/20 147 lb 9.6 oz (67 kg)  09/24/19 141 lb (64 kg)      Other studies Reviewed: Additional studies/ records that were reviewed today include: ETT 12/25/17  Review of the above records today demonstrates:  BASELINE EKG NORMAL AT 74/MIN.   PT EXERCISED 7 MIN, BRUCE AT 8 METS, ABOVE AVERAGE FUNCTIONAL  CAPACITY FOR AGE. ACHIEVED HR 136/MIN 86% MPHR. NO ANGINA OR ST ABNORMALITY RARE PAC/PVC.  TTE 08/03/2019  1. Left ventricular ejection fraction, by estimation, is 60 to 65%. The  left ventricle has normal function. The left ventricle has no regional  wall motion abnormalities. Left ventricular diastolic parameters are  consistent with Grade I diastolic  dysfunction (impaired relaxation).   2. Right ventricular systolic function is normal. The right ventricular  size is normal.  There is normal pulmonary artery systolic pressure.   3. The mitral valve is normal in structure. No evidence of mitral valve  regurgitation. No evidence of mitral stenosis.   ASSESSMENT AND PLAN:  1.  Paroxysmal atrial fibrillation: Diagnosed in [place].  CHA2DS2-VASc 1.  Currently on flecainide 100 mg as needed, diltiazem as needed.  She has not had any further episodes of prolonged atrial fibrillation.  She is overall happy with her control.  We Lain Tetterton continue with current management.  Current medicines are reviewed at length with the patient today.   The patient does not have concerns regarding her medicines.  The following changes were made today: None  Labs/ tests ordered today include:  Orders Placed This Encounter  Procedures   EKG 12-Lead      Disposition:   FU with Derica Leiber 12 months  Signed, Odelle Kosier Jorja Loa, MD  02/22/2021 10:33 AM     [facility] [address] [place] [address] [phone] (office) [phone] (fax)

## 2021-02-22 NOTE — Patient Instructions (Signed)
Medication Instructions:  °Your physician recommends that you continue on your current medications as directed. Please refer to the Current Medication list given to you today. ° °*If you need a refill on your cardiac medications before your next appointment, please call your pharmacy* ° ° °Lab Work: °None ordered ° ° °Testing/Procedures: °None ordered ° ° °Follow-Up: °At [facility], you and your health needs are our priority.  As part of our continuing mission to provide you with exceptional heart care, we have created designated Provider Care Teams.  These Care Teams include your primary Cardiologist (physician) and Advanced Practice Providers (APPs -  Physician Assistants and Nurse Practitioners) who all work together to provide you with the care you need, when you need it. ° °Your next appointment:   °1 year(s) ° °The format for your next appointment:   °In Person ° °Provider:   °Will Camnitz, MD ° ° ° °Thank you for choosing [facility]!! ° ° °Rechel Delosreyes, RN °[phone] ° ° ° °

## 2021-02-27 ENCOUNTER — Ambulatory Visit: Payer: BC Managed Care – PPO | Admitting: Cardiology

## 2021-04-09 IMAGING — CT CT CERVICAL SPINE W/O CM
3 of 4 series · 11 of 33 positions shown, 13 images · non-contrast
Comparison: 10/16/2016

CLINICAL DATA: Patient fell, does not of she struck her head,
possible loss of consciousness; history atrial fibrillation,
migraine

EXAM:
CT HEAD WITHOUT CONTRAST
CT CERVICAL SPINE WITHOUT CONTRAST
TECHNIQUE: Multidetector CT imaging of the head and cervical spine was
performed following the standard protocol without intravenous
contrast. Multiplanar CT image reconstructions of the cervical spine
were also generated.

[Series 5: c_spine 2.0 st · axial · 0.33mm/px · z∈[-305,-183]mm · 3 of 93 slices shown, 4 images]
[im 16/93  soft-tissue]
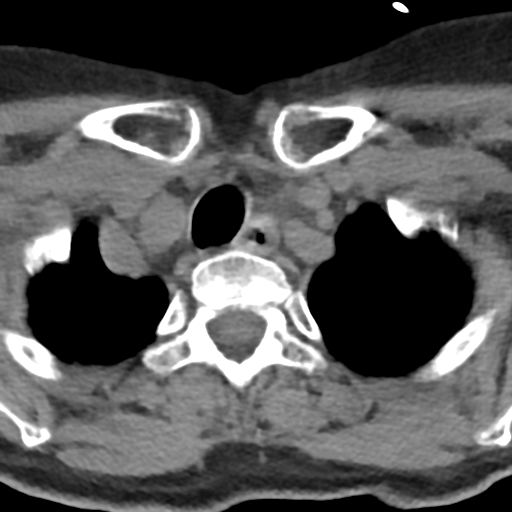
[im 16/93  bone]
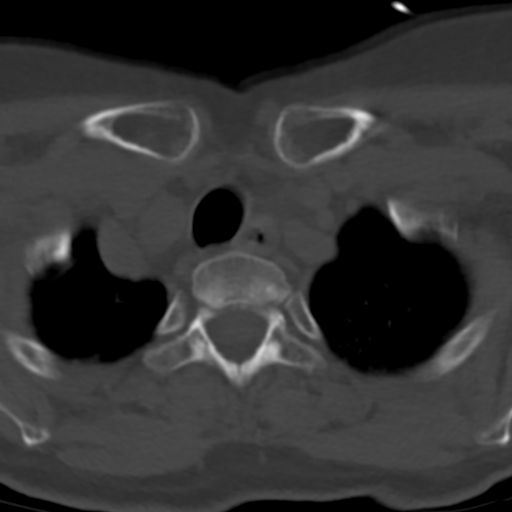
[im 47/93  bone]
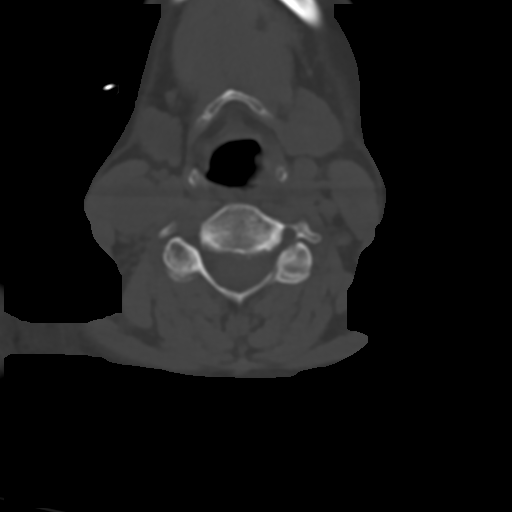
[im 77/93  bone]
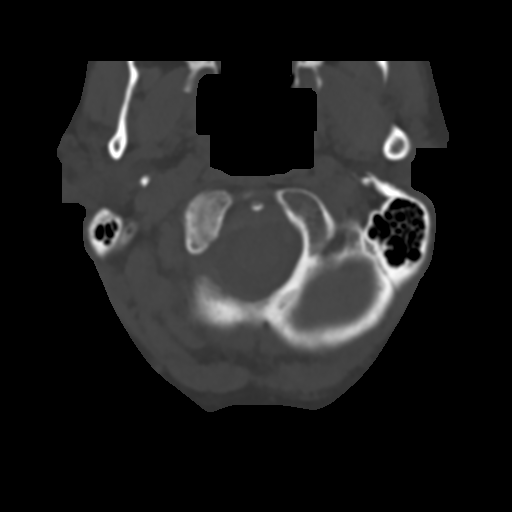

[Series 8: coronal bone · coronal · 0.27mm/px · 3 of 54 slices shown]
[im 11/54  bone]
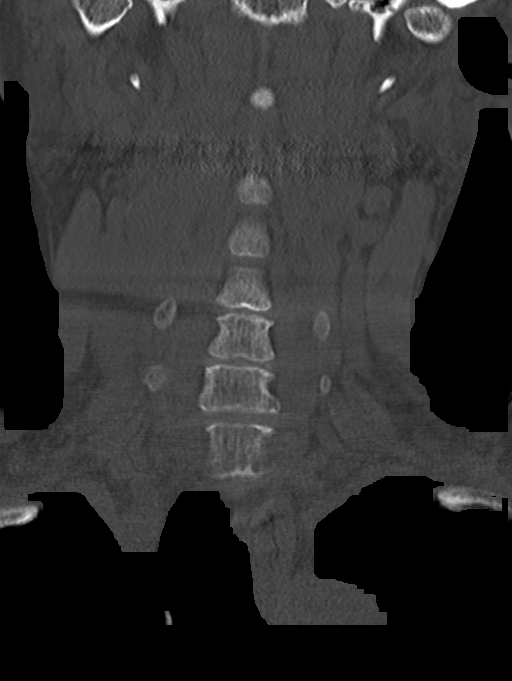
[im 22/54  bone]
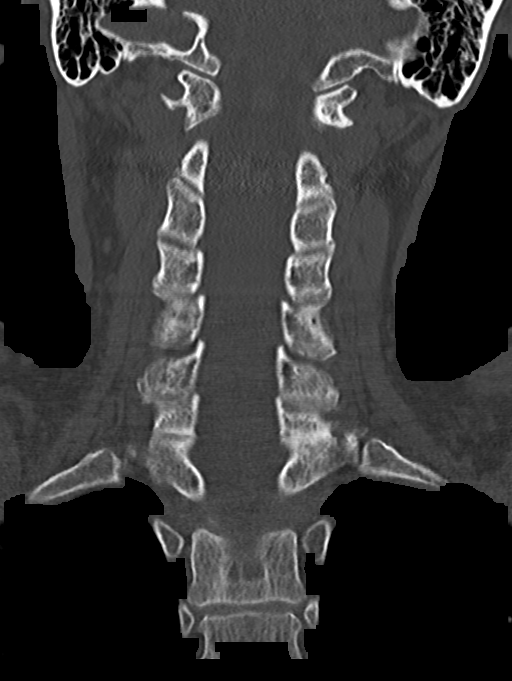
[im 32/54  bone]
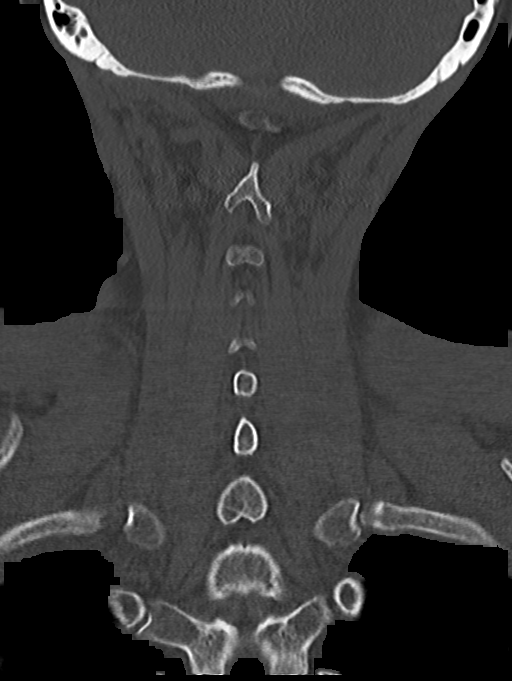

[Series 9: sagittal bone · sagittal · 0.21mm/px · 5 of 61 slices shown, 6 images]
[im 21/61  bone]
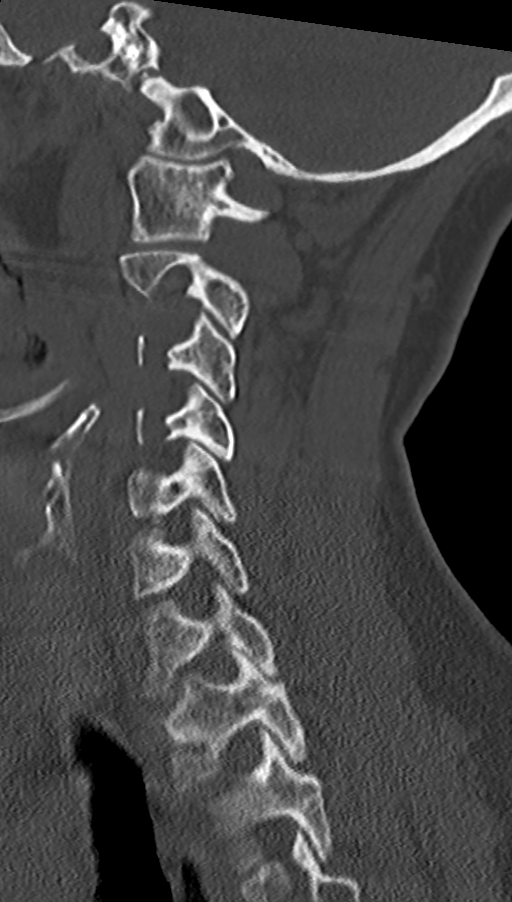
[im 26/61  bone]
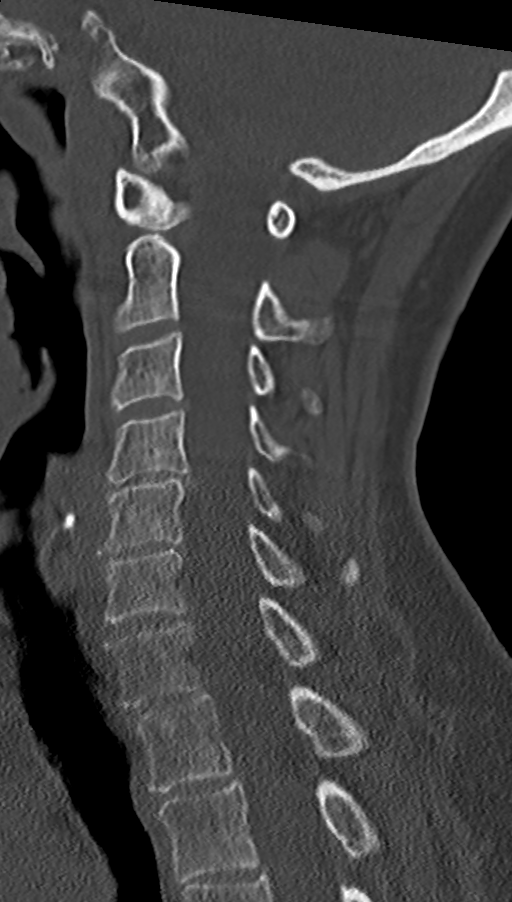
[im 31/61  soft-tissue]
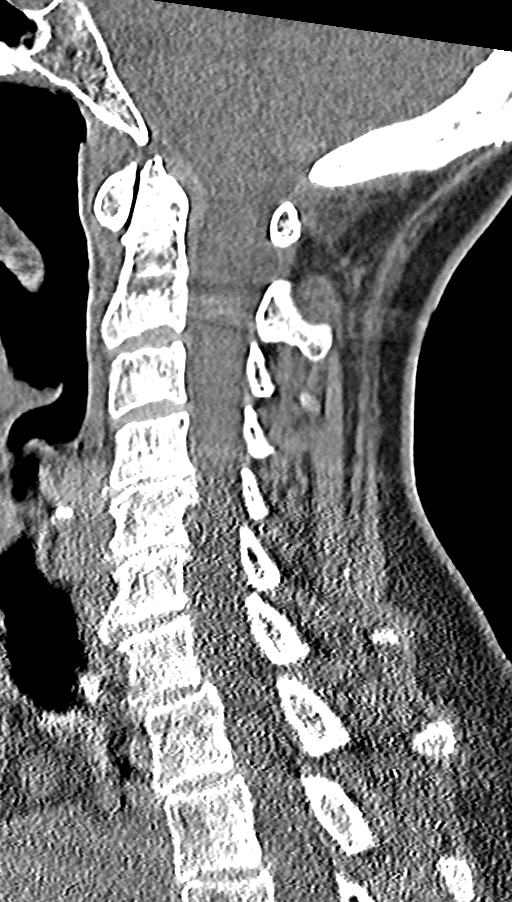
[im 31/61  bone]
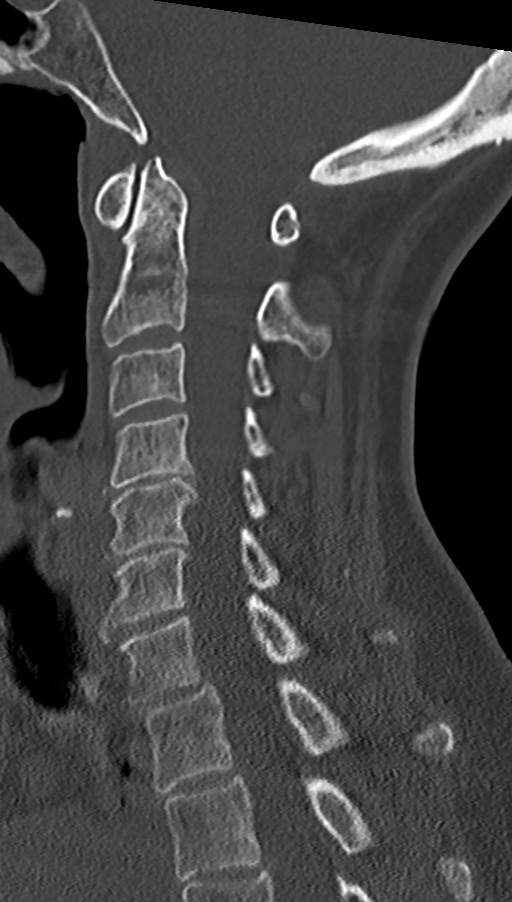
[im 36/61  bone]
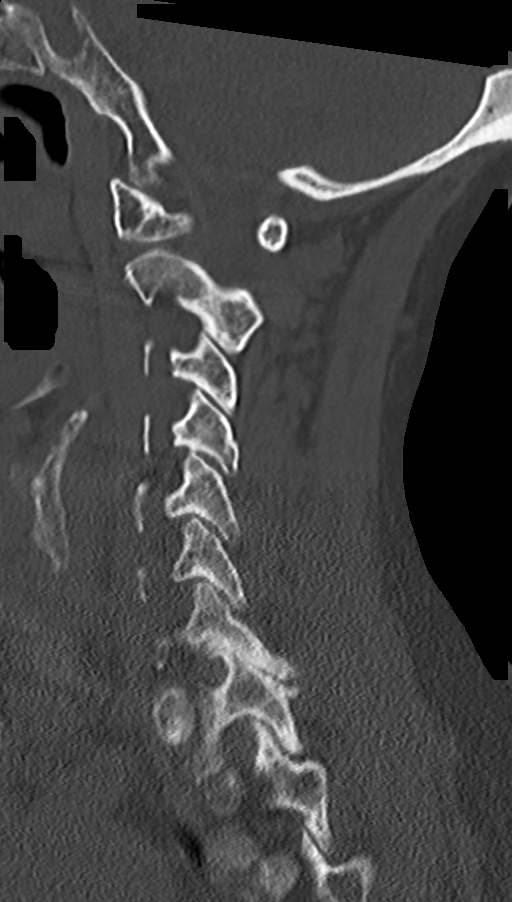
[im 41/61  bone]
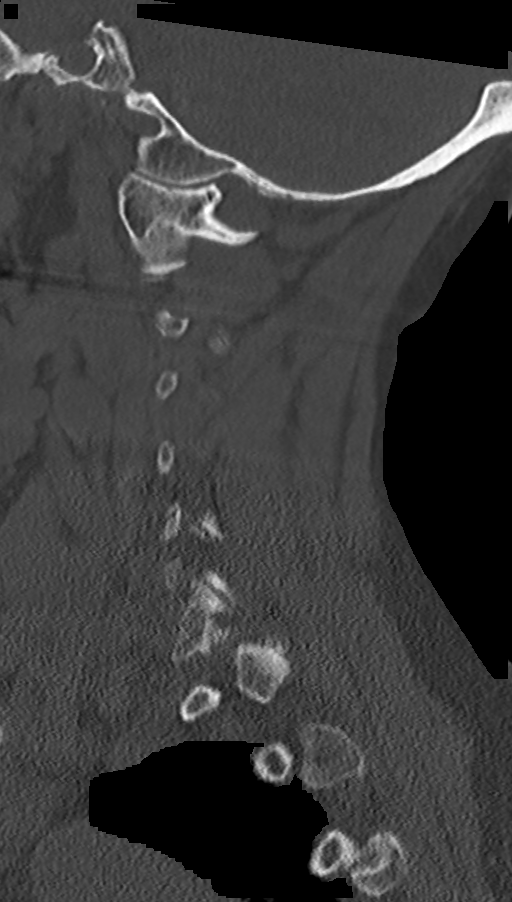

[11 of 33 positions shown; findings below may reference images not displayed]

FINDINGS: CT HEAD FINDINGS

Brain: Normal ventricular morphology. No midline shift or mass
effect. Small old infarct at inferior LEFT cerebellar hemisphere.
Probable prominent perivascular space at inferior RIGHT basal
ganglia unchanged. No intracranial hemorrhage, mass lesion or
evidence of acute infarction. No extra-axial fluid collections.

Vascular: No hyperdense vessels

Skull: Intact

Sinuses/Orbits: Clear

Other: N/A

CT CERVICAL SPINE FINDINGS

Alignment: Normal

Skull base and vertebrae: Osseous demineralization. Disc space
narrowing and minor endplate spur formation C4-C5, C5-C6 and C6-C7.
Minimal scattered facet degenerative changes. Small bone island LEFT
C4 vertebral body. Vertebral body heights maintained without
fracture or subluxation. Visualized skull base intact.

Soft tissues and spinal canal: Prevertebral soft tissues normal
thickness.

Disc levels: Small central disc protrusion at C4-C5. Endplate
spurring effaces CSF at the ventral aspect of thecal sac at C5-C6.

Upper chest: Lung apices clear

Other: N/A
IMPRESSION: Small old LEFT cerebellar infarct.

No acute intracranial abnormalities.

Degenerative disc and facet disease changes of the cervical spine.

Small central disc protrusion at C4-C5 and endplate spurring at
C5-C6.

No acute cervical spine abnormalities.

## 2021-04-09 IMAGING — CT CT HEAD W/O CM
3 series · 15 of 47 positions shown, 18 images · non-contrast
Comparison: 10/16/2016

CLINICAL DATA: Patient fell, does not of she struck her head,
possible loss of consciousness; history atrial fibrillation,
migraine

EXAM:
CT HEAD WITHOUT CONTRAST
CT CERVICAL SPINE WITHOUT CONTRAST
TECHNIQUE: Multidetector CT imaging of the head and cervical spine was
performed following the standard protocol without intravenous
contrast. Multiplanar CT image reconstructions of the cervical spine
were also generated.

[Series 1: head 5.0 h30s · axial · 0.42mm/px · z∈[-157,-27]mm · 9 of 32 slices shown, 12 images]
[im 3/32  brain]
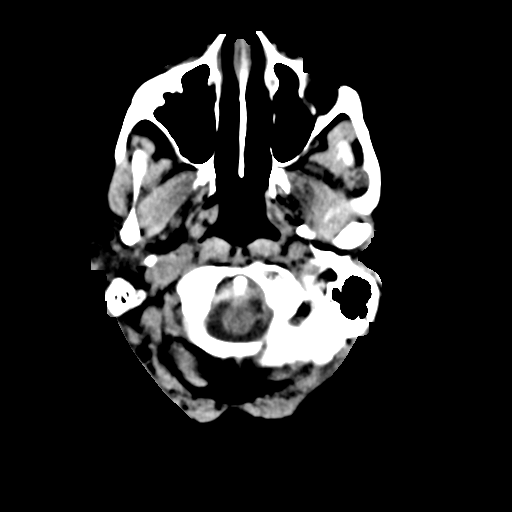
[im 3/32  bone]
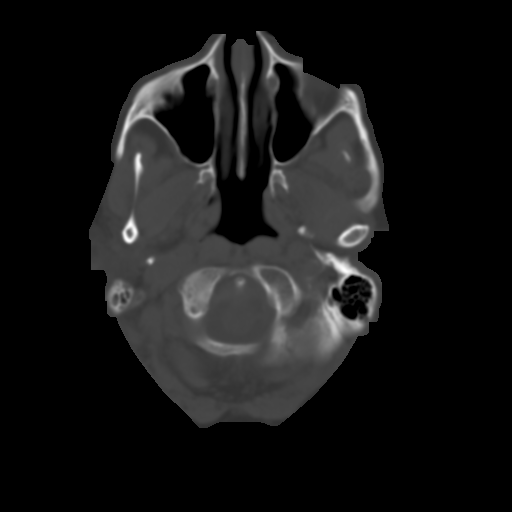
[im 6/32  brain]
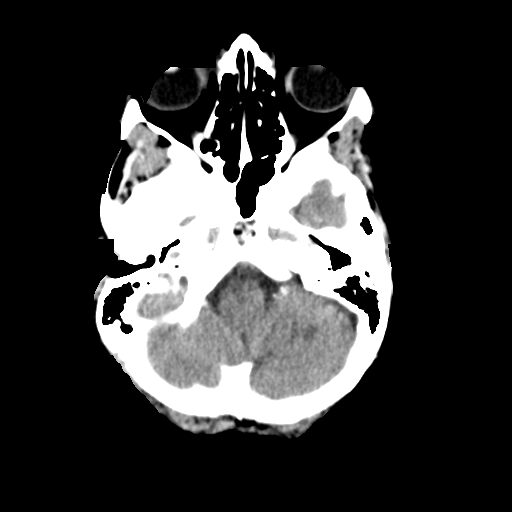
[im 9/32  brain]
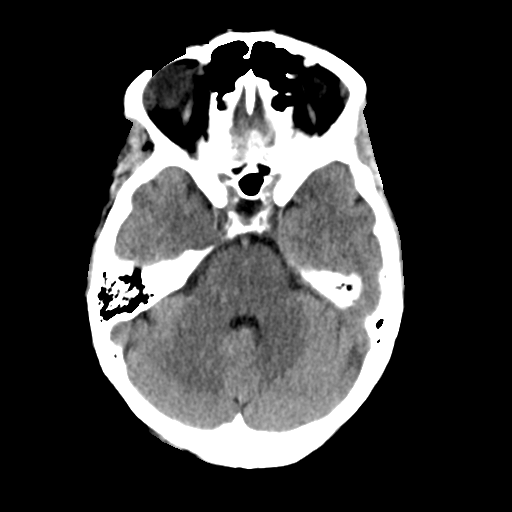
[im 12/32  brain]
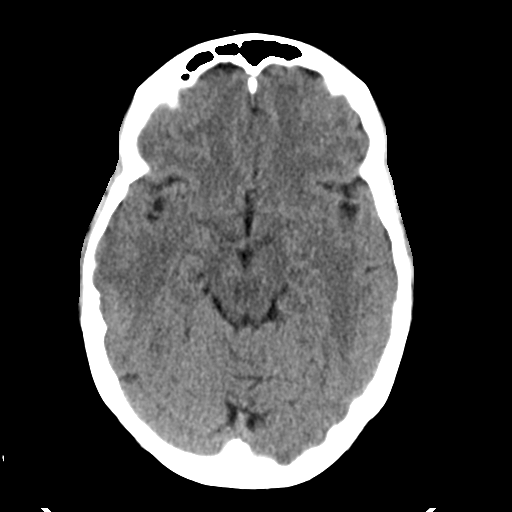
[im 17/32  brain]
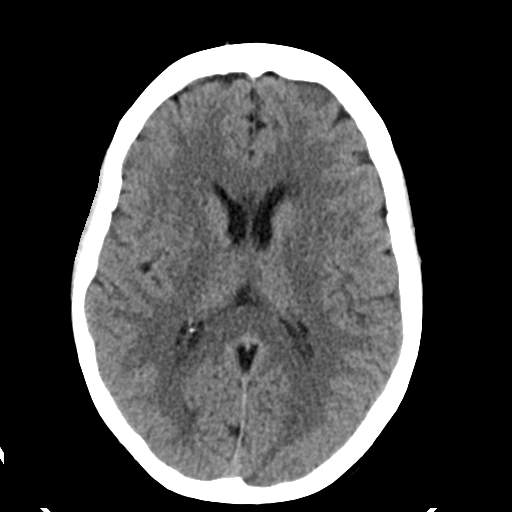
[im 17/32  bone]
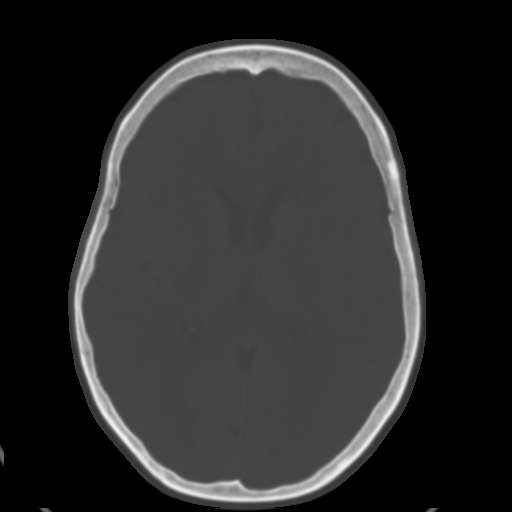
[im 20/32  brain]
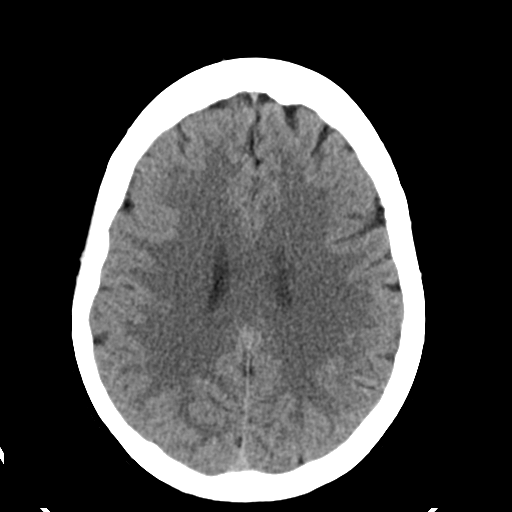
[im 23/32  brain]
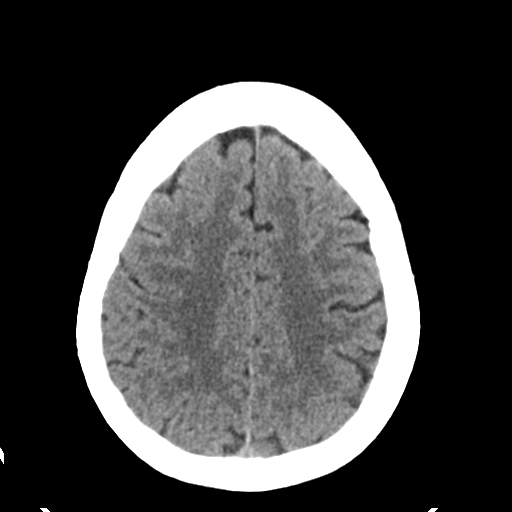
[im 26/32  brain]
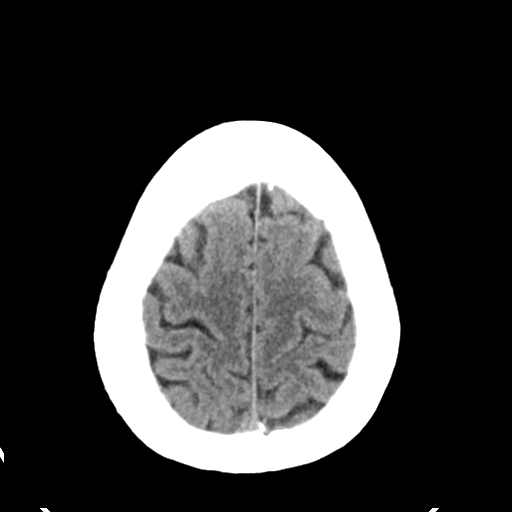
[im 29/32  brain]
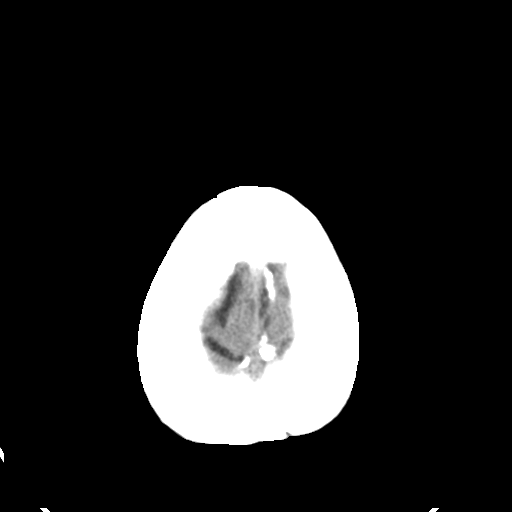
[im 29/32  bone]
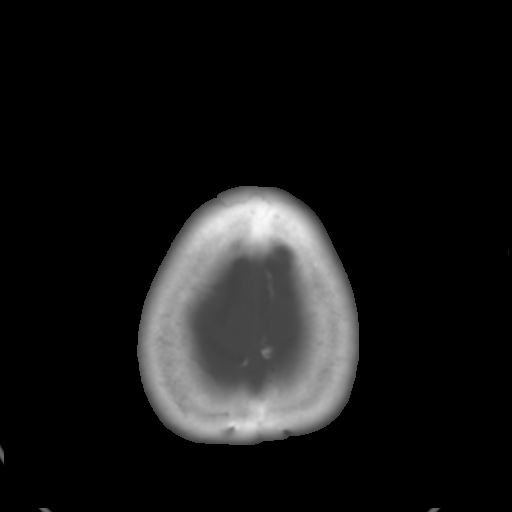

[Series 5: head 3.0 mpr cor · coronal · 0.31mm/px · 3 of 79 slices shown]
[im 27/79  brain]
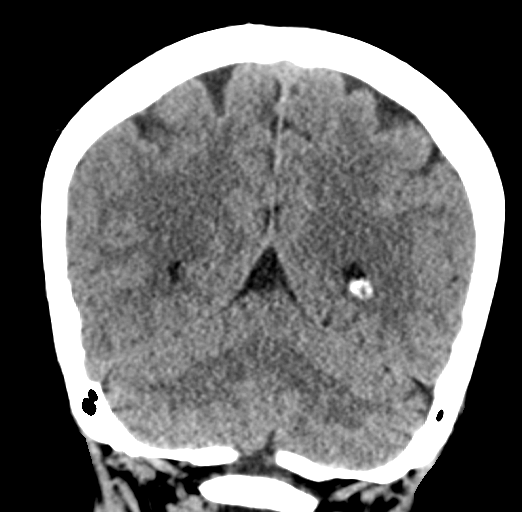
[im 35/79  brain]
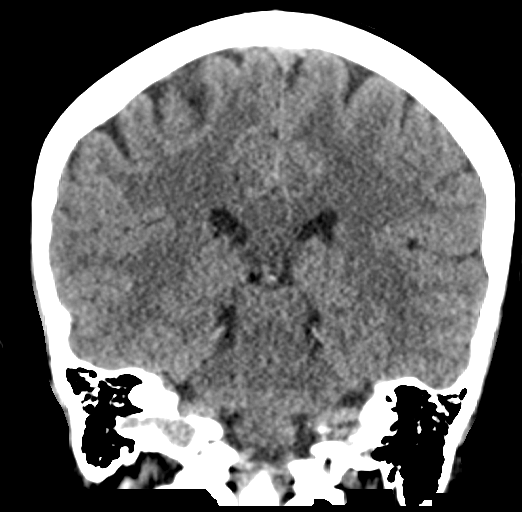
[im 44/79  brain]
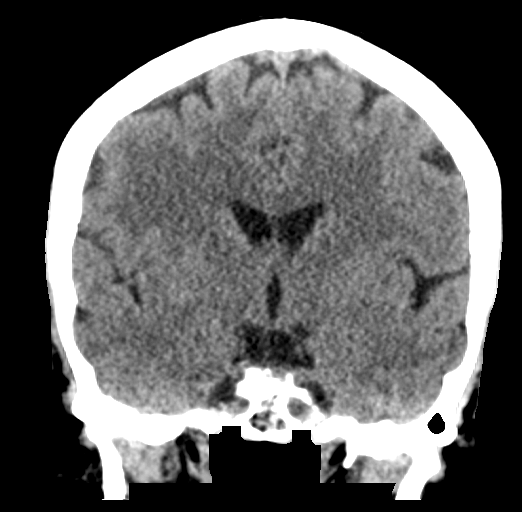

[Series 6: head 3.0 mpr sag · sagittal · 0.31mm/px · 3 of 56 slices shown]
[im 19/56  brain]
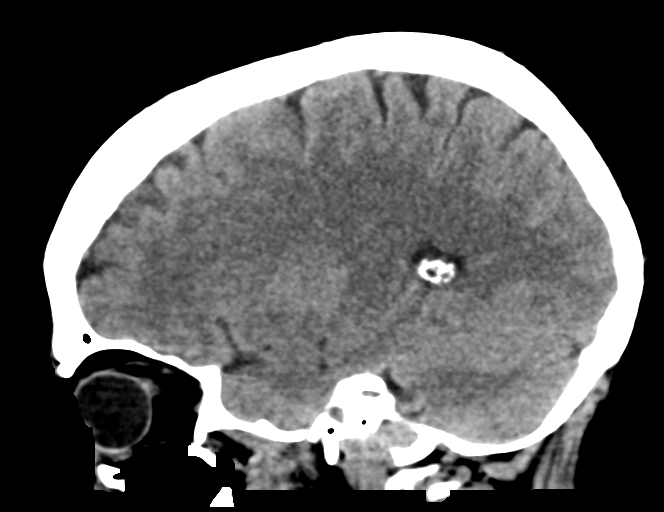
[im 28/56  brain]
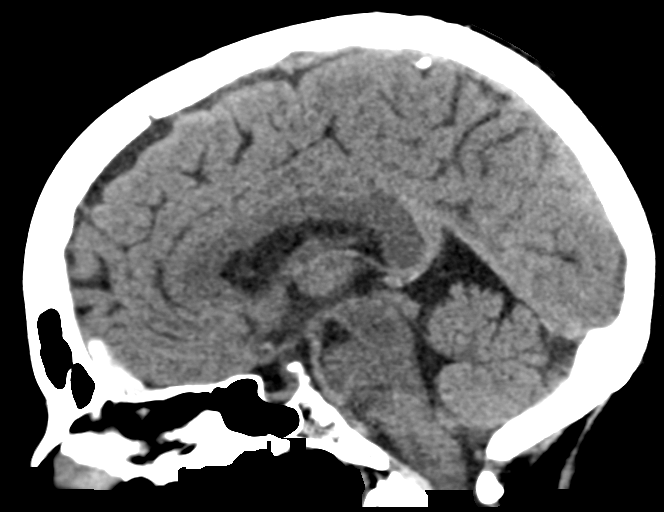
[im 37/56  brain]
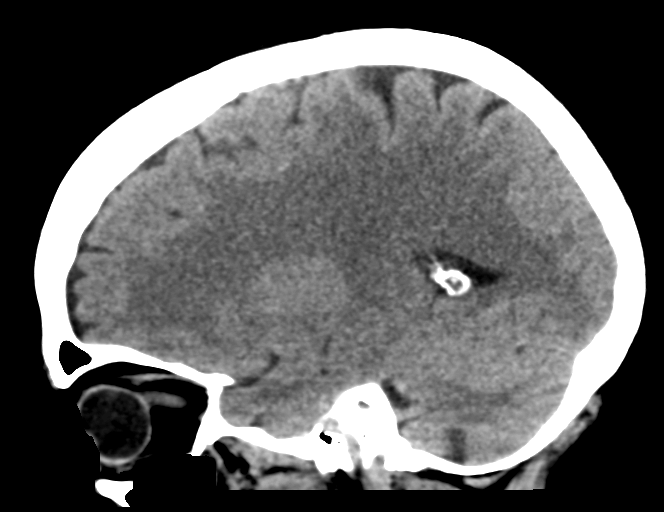

[15 of 47 positions shown; findings below may reference images not displayed]

FINDINGS: CT HEAD FINDINGS

Brain: Normal ventricular morphology. No midline shift or mass
effect. Small old infarct at inferior LEFT cerebellar hemisphere.
Probable prominent perivascular space at inferior RIGHT basal
ganglia unchanged. No intracranial hemorrhage, mass lesion or
evidence of acute infarction. No extra-axial fluid collections.

Vascular: No hyperdense vessels

Skull: Intact

Sinuses/Orbits: Clear

Other: N/A

CT CERVICAL SPINE FINDINGS

Alignment: Normal

Skull base and vertebrae: Osseous demineralization. Disc space
narrowing and minor endplate spur formation C4-C5, C5-C6 and C6-C7.
Minimal scattered facet degenerative changes. Small bone island LEFT
C4 vertebral body. Vertebral body heights maintained without
fracture or subluxation. Visualized skull base intact.

Soft tissues and spinal canal: Prevertebral soft tissues normal
thickness.

Disc levels: Small central disc protrusion at C4-C5. Endplate
spurring effaces CSF at the ventral aspect of thecal sac at C5-C6.

Upper chest: Lung apices clear

Other: N/A
IMPRESSION: Small old LEFT cerebellar infarct.

No acute intracranial abnormalities.

Degenerative disc and facet disease changes of the cervical spine.

Small central disc protrusion at C4-C5 and endplate spurring at
C5-C6.

No acute cervical spine abnormalities.

## 2021-04-13 ENCOUNTER — Other Ambulatory Visit: Payer: Self-pay | Admitting: Obstetrics

## 2021-04-13 ENCOUNTER — Other Ambulatory Visit (HOSPITAL_COMMUNITY): Payer: Self-pay | Admitting: Obstetrics

## 2021-04-13 DIAGNOSIS — R87629 Unspecified abnormal cytological findings in specimens from vagina: Secondary | ICD-10-CM

## 2021-04-20 ENCOUNTER — Encounter (HOSPITAL_BASED_OUTPATIENT_CLINIC_OR_DEPARTMENT_OTHER): Payer: Self-pay | Admitting: Obstetrics

## 2021-04-24 ENCOUNTER — Encounter (HOSPITAL_BASED_OUTPATIENT_CLINIC_OR_DEPARTMENT_OTHER): Payer: Self-pay | Admitting: Obstetrics

## 2021-04-24 ENCOUNTER — Other Ambulatory Visit: Payer: Self-pay

## 2021-04-24 NOTE — Progress Notes (Signed)
Spoke w/ via phone for pre-op interview--- [facility] Lab needs dos---- NONE              Lab results------ COVID test -----patient states asymptomatic no test needed Arrive at -------1000 NPO after MN NO Solid Food.  Clear liquids from MN until---0900 Med rec completed Medications to take morning of surgery -----NONE Diabetic medication ----- Patient instructed no nail polish to be worn day of surgery Patient instructed to bring photo id and insurance card day of surgery Patient aware to have Driver (ride ) / caregiver  (Husband ) Rodger Lada  for 24 hours after surgery  Patient Special Instructions ----- Pre-Op special Istructions ----- Patient verbalized understanding of instructions that were given at this phone interview. Patient denies shortness of breath, chest pain, fever, cough at this phone interview.

## 2021-04-26 ENCOUNTER — Ambulatory Visit (HOSPITAL_BASED_OUTPATIENT_CLINIC_OR_DEPARTMENT_OTHER)
Admission: RE | Admit: 2021-04-26 | Discharge: 2021-04-26 | Disposition: A | Discharge status: Home / Self Care | Payer: BC Managed Care – PPO | Attending: Obstetrics | Admitting: Obstetrics

## 2021-04-26 ENCOUNTER — Other Ambulatory Visit: Payer: Self-pay

## 2021-04-26 ENCOUNTER — Ambulatory Visit (HOSPITAL_BASED_OUTPATIENT_CLINIC_OR_DEPARTMENT_OTHER): Payer: BC Managed Care – PPO | Admitting: Anesthesiology

## 2021-04-26 ENCOUNTER — Ambulatory Visit (HOSPITAL_COMMUNITY)
Admission: RE | Admit: 2021-04-26 | Discharge: 2021-04-26 | Disposition: A | Discharge status: Home / Self Care | Payer: BC Managed Care – PPO | Source: Ambulatory Visit | Attending: Obstetrics | Admitting: Obstetrics

## 2021-04-26 ENCOUNTER — Encounter (HOSPITAL_BASED_OUTPATIENT_CLINIC_OR_DEPARTMENT_OTHER): Payer: Self-pay | Admitting: Obstetrics

## 2021-04-26 ENCOUNTER — Encounter (HOSPITAL_BASED_OUTPATIENT_CLINIC_OR_DEPARTMENT_OTHER)
Admission: RE | Disposition: A | Discharge status: Home / Self Care | Payer: Self-pay | Source: Home / Self Care | Attending: Obstetrics

## 2021-04-26 DIAGNOSIS — N72 Inflammatory disease of cervix uteri: Secondary | ICD-10-CM | POA: Insufficient documentation

## 2021-04-26 DIAGNOSIS — F419 Anxiety disorder, unspecified: Secondary | ICD-10-CM | POA: Diagnosis not present

## 2021-04-26 DIAGNOSIS — N879 Dysplasia of cervix uteri, unspecified: Secondary | ICD-10-CM | POA: Diagnosis not present

## 2021-04-26 DIAGNOSIS — R896 Abnormal cytological findings in specimens from other organs, systems and tissues: Secondary | ICD-10-CM | POA: Diagnosis not present

## 2021-04-26 DIAGNOSIS — I4891 Unspecified atrial fibrillation: Secondary | ICD-10-CM | POA: Diagnosis not present

## 2021-04-26 DIAGNOSIS — N888 Other specified noninflammatory disorders of cervix uteri: Secondary | ICD-10-CM | POA: Insufficient documentation

## 2021-04-26 DIAGNOSIS — R87629 Unspecified abnormal cytological findings in specimens from vagina: Secondary | ICD-10-CM

## 2021-04-26 DIAGNOSIS — R87619 Unspecified abnormal cytological findings in specimens from cervix uteri: Secondary | ICD-10-CM | POA: Diagnosis not present

## 2021-04-26 DIAGNOSIS — N858 Other specified noninflammatory disorders of uterus: Secondary | ICD-10-CM | POA: Diagnosis not present

## 2021-04-26 DIAGNOSIS — I10 Essential (primary) hypertension: Secondary | ICD-10-CM | POA: Diagnosis not present

## 2021-04-26 HISTORY — PX: OPERATIVE ULTRASOUND: SHX5996

## 2021-04-26 HISTORY — PX: HYSTEROSCOPY WITH D & C: SHX1775

## 2021-04-26 HISTORY — DX: Cardiac arrhythmia, unspecified: I49.9

## 2021-04-26 HISTORY — PX: LEEP: SHX91

## 2021-04-26 SURGERY — DILATATION AND CURETTAGE /HYSTEROSCOPY
Anesthesia: General

## 2021-04-26 MED ORDER — MIDAZOLAM HCL 2 MG/2ML IJ SOLN
INTRAMUSCULAR | Status: AC
  Filled 2021-04-26: qty 2

## 2021-04-26 MED ORDER — ONDANSETRON HCL 4 MG/2ML IJ SOLN
INTRAMUSCULAR | Status: AC
  Filled 2021-04-26: qty 2

## 2021-04-26 MED ORDER — LIDOCAINE HCL (PF) 2 % IJ SOLN
INTRAMUSCULAR | Status: AC
  Filled 2021-04-26: qty 5

## 2021-04-26 MED ORDER — LACTATED RINGERS IV SOLN
INTRAVENOUS | Status: DC
  Administered 2021-04-26: 1000 mL via INTRAVENOUS

## 2021-04-26 MED ORDER — KETOROLAC TROMETHAMINE 30 MG/ML IJ SOLN
INTRAMUSCULAR | Status: DC | PRN
  Administered 2021-04-26: 30 mg via INTRAVENOUS

## 2021-04-26 MED ORDER — PROPOFOL 10 MG/ML IV BOLUS
INTRAVENOUS | Status: DC | PRN
Start: 2021-04-26 — End: 2021-04-26
  Administered 2021-04-26: 130 mg via INTRAVENOUS

## 2021-04-26 MED ORDER — PHENYLEPHRINE 40 MCG/ML (10ML) SYRINGE FOR IV PUSH (FOR BLOOD PRESSURE SUPPORT)
PREFILLED_SYRINGE | INTRAVENOUS | Status: DC | PRN
  Administered 2021-04-26: 80 ug via INTRAVENOUS

## 2021-04-26 MED ORDER — MIDAZOLAM HCL 5 MG/5ML IJ SOLN
INTRAMUSCULAR | Status: DC | PRN
  Administered 2021-04-26: 2 mg via INTRAVENOUS

## 2021-04-26 MED ORDER — FENTANYL CITRATE (PF) 250 MCG/5ML IJ SOLN
INTRAMUSCULAR | Status: AC
  Filled 2021-04-26: qty 5

## 2021-04-26 MED ORDER — FENTANYL CITRATE (PF) 100 MCG/2ML IJ SOLN
INTRAMUSCULAR | Status: DC | PRN
  Administered 2021-04-26 (×2): 50 ug via INTRAVENOUS

## 2021-04-26 MED ORDER — DEXAMETHASONE SODIUM PHOSPHATE 10 MG/ML IJ SOLN
INTRAMUSCULAR | Status: AC
  Filled 2021-04-26: qty 1

## 2021-04-26 MED ORDER — DEXAMETHASONE SODIUM PHOSPHATE 4 MG/ML IJ SOLN
INTRAMUSCULAR | Status: DC | PRN
  Administered 2021-04-26: 10 mg via INTRAVENOUS

## 2021-04-26 MED ORDER — ACETAMINOPHEN 500 MG PO TABS
ORAL_TABLET | ORAL | Status: AC
  Filled 2021-04-26: qty 2

## 2021-04-26 MED ORDER — FENTANYL CITRATE (PF) 100 MCG/2ML IJ SOLN
INTRAMUSCULAR | Status: AC
  Filled 2021-04-26: qty 2

## 2021-04-26 MED ORDER — ONDANSETRON HCL 4 MG/2ML IJ SOLN
INTRAMUSCULAR | Status: DC | PRN
  Administered 2021-04-26: 4 mg via INTRAVENOUS

## 2021-04-26 MED ORDER — SODIUM CHLORIDE 0.9 % IR SOLN
Status: DC | PRN
  Administered 2021-04-26: 1

## 2021-04-26 MED ORDER — FENTANYL CITRATE (PF) 100 MCG/2ML IJ SOLN: 25.0000 ug | INTRAMUSCULAR | Status: DC | PRN

## 2021-04-26 MED ORDER — LIDOCAINE-EPINEPHRINE (PF) 1 %-1:200000 IJ SOLN
INTRAMUSCULAR | Status: DC | PRN
  Administered 2021-04-26: 20 mL

## 2021-04-26 MED ORDER — ONDANSETRON HCL 4 MG/2ML IJ SOLN: 4.0000 mg | Freq: Once | INTRAMUSCULAR | Status: DC | PRN

## 2021-04-26 MED ORDER — ACETAMINOPHEN 500 MG PO TABS
1000.0000 mg | ORAL_TABLET | Freq: Once | ORAL | Status: AC
  Administered 2021-04-26: 1000 mg via ORAL

## 2021-04-26 MED ORDER — LIDOCAINE HCL (CARDIAC) PF 100 MG/5ML IV SOSY
PREFILLED_SYRINGE | INTRAVENOUS | Status: DC | PRN
  Administered 2021-04-26: 60 mg via INTRAVENOUS

## 2021-04-26 MED ORDER — EPHEDRINE SULFATE-NACL 50-0.9 MG/10ML-% IV SOSY
PREFILLED_SYRINGE | INTRAVENOUS | Status: DC | PRN
Start: 2021-04-26 — End: 2021-04-26
  Administered 2021-04-26: 10 mg via INTRAVENOUS
  Administered 2021-04-26: 5 mg via INTRAVENOUS

## 2021-04-26 MED ORDER — IODINE STRONG (LUGOLS) 5 % PO SOLN
ORAL | Status: DC | PRN
  Administered 2021-04-26: 0.2 mL via ORAL

## 2021-04-26 SURGICAL SUPPLY — 25 items
DRSG TELFA 3X8 NADH (GAUZE/BANDAGES/DRESSINGS) ×2 IMPLANT
ELECT BALL LEEP 5MM RED (ELECTRODE) ×1 IMPLANT
ELECT LEEP 2.0X0.8 R2008 (MISCELLANEOUS) ×2
ELECT REM PT RETURN 9FT ADLT (ELECTROSURGICAL) ×2
ELECTRODE LEEP 2.0X0.8 R2008 (MISCELLANEOUS) IMPLANT
ELECTRODE REM PT RTRN 9FT ADLT (ELECTROSURGICAL) ×1 IMPLANT
GAUZE 4X4 16PLY ~~LOC~~+RFID DBL (SPONGE) ×2 IMPLANT
GLOVE SURG LTX SZ6 (GLOVE) ×2 IMPLANT
GLOVE SURG UNDER POLY LF SZ6 (GLOVE) ×2 IMPLANT
GOWN STRL REUS W/TWL LRG LVL3 (GOWN DISPOSABLE) ×4 IMPLANT
HIBICLENS CHG 4% 4OZ BTL (MISCELLANEOUS) ×2 IMPLANT
IV NS IRRIG 3000ML ARTHROMATIC (IV SOLUTION) ×2 IMPLANT
KIT PROCEDURE FLUENT (KITS) ×2 IMPLANT
KIT TURNOVER CYSTO (KITS) ×2 IMPLANT
NS IRRIG 1000ML POUR BTL (IV SOLUTION) ×2 IMPLANT
PACK VAGINAL MINOR WOMEN LF (CUSTOM PROCEDURE TRAY) ×2 IMPLANT
PAD DRESSING TELFA 3X8 NADH (GAUZE/BANDAGES/DRESSINGS) ×1 IMPLANT
PAD OB MATERNITY 4.3X12.25 (PERSONAL CARE ITEMS) ×2 IMPLANT
PENCIL SMOKE EVACUATOR (MISCELLANEOUS) ×2 IMPLANT
SCOPETTES 8  STERILE (MISCELLANEOUS) ×2
SCOPETTES 8 STERILE (MISCELLANEOUS) IMPLANT
SEAL ROD LENS SCOPE MYOSURE (ABLATOR) ×2 IMPLANT
SUT VIC AB 2-0 SH 27 (SUTURE) ×2
SUT VIC AB 2-0 SH 27XBRD (SUTURE) IMPLANT
TOWEL OR 17X26 10 PK STRL BLUE (TOWEL DISPOSABLE) ×3 IMPLANT

## 2021-04-26 NOTE — Anesthesia Preprocedure Evaluation (Signed)
Anesthesia Evaluation  Patient identified by MRN, date of birth, ID band Patient awake    Reviewed: Allergy & Precautions, NPO status , Patient's Chart, lab work & pertinent test results  Airway Mallampati: II  TM Distance: >3 FB Neck ROM: Full    Dental  (+) Teeth Intact, Dental Advisory Given   Pulmonary neg pulmonary ROS,    Pulmonary exam normal breath sounds clear to auscultation       Cardiovascular hypertension, Pt. on medications Normal cardiovascular exam+ dysrhythmias Atrial Fibrillation  Rhythm:Regular Rate:Normal     Neuro/Psych  Headaches, PSYCHIATRIC DISORDERS Anxiety Cervical stenosis Thoracic stenosis Lumbar stenosis   Neuromuscular disease    GI/Hepatic negative GI ROS, Neg liver ROS,   Endo/Other  negative endocrine ROS  Renal/GU negative Renal ROS     Musculoskeletal  (+) Arthritis ,   Abdominal   Peds  Hematology negative hematology ROS (+)   Anesthesia Other Findings Day of surgery medications reviewed with the patient.  Reproductive/Obstetrics atypical gladular cells, favors neoplasia                            Anesthesia Physical Anesthesia Plan  ASA: 3  Anesthesia Plan: General   Post-op Pain Management: Tylenol PO (pre-op)   Induction: Intravenous  PONV Risk Score and Plan: 4 or greater and Dexamethasone, Ondansetron and Midazolam  Airway Management Planned: LMA  Additional Equipment:   Intra-op Plan:   Post-operative Plan: Extubation in OR  Informed Consent: I have reviewed the patients History and Physical, chart, labs and discussed the procedure including the risks, benefits and alternatives for the proposed anesthesia with the patient or authorized representative who has indicated his/her understanding and acceptance.     Dental advisory given  Plan Discussed with: CRNA  Anesthesia Plan Comments:         Anesthesia Quick  Evaluation

## 2021-04-26 NOTE — Discharge Instructions (Addendum)
No acetaminophen/Tylenol until after 4:30pm today if needed for pain.   No ibuprofen, Advil, Aleve, Motrin, ketorolac, meloxicam, naproxen, or other NSAIDS until after 6:45 pm today if needed for pain.     Post Anesthesia Home Care Instructions  Activity: Get plenty of rest for the remainder of the day. A responsible individual must stay with you for 24 hours following the procedure.  For the next 24 hours, DO NOT: -Drive a car -Advertising copywriter -Drink alcoholic beverages -Take any medication unless instructed by your physician -Make any legal decisions or sign important papers.  Meals: Start with liquid foods such as gelatin or soup. Progress to regular foods as tolerated. Avoid greasy, spicy, heavy foods. If nausea and/or vomiting occur, drink only clear liquids until the nausea and/or vomiting subsides. Call your physician if vomiting continues.  Special Instructions/Symptoms: Your throat may feel dry or sore from the anesthesia or the breathing tube placed in your throat during surgery. If this causes discomfort, gargle with warm salt water. The discomfort should disappear within 24 hours.    DISCHARGE INSTRUCTIONS: D&C / D&E The following instructions have been prepared to help you care for yourself upon your return home.   Personal hygiene:  Use sanitary pads for vaginal drainage, not tampons.  Shower the day after your procedure.  NO tub baths, pools or Jacuzzis for 2-3 weeks.  Wipe front to back after using the bathroom.  Activity and limitations:  Do NOT drive or operate any equipment for 24 hours. The effects of anesthesia are still present and drowsiness may result.  Do NOT rest in bed all day.  Walking is encouraged.  Walk up and down stairs slowly.  You may resume your normal activity in one to two days or as indicated by your physician.  Sexual activity: NO intercourse for at least 2 weeks after the procedure, or as indicated by your physician.  Diet: Eat a  light meal as desired this evening. You may resume your usual diet tomorrow.  Return to work: You may resume your work activities in one to two days or as indicated by your doctor.  What to expect after your surgery: Expect to have vaginal bleeding/discharge for 2-3 days and spotting for up to 10 days. It is not unusual to have soreness for up to 1-2 weeks. You may have a slight burning sensation when you urinate for the first day. Mild cramps may continue for a couple of days. You may have a regular period in 2-6 weeks.  Call your doctor for any of the following:  Excessive vaginal bleeding, saturating and changing one pad every hour.  Inability to urinate 6 hours after discharge from hospital.  Pain not relieved by pain medication.  Fever of 100.4 F or greater.  Unusual vaginal discharge or odor.

## 2021-04-26 NOTE — Anesthesia Procedure Notes (Signed)
Procedure Name: LMA Insertion Date/Time: 04/26/2021 12:11 PM Performed by: Caren Macadam, CRNA Pre-anesthesia Checklist: Patient identified, Emergency Drugs available, Suction available and Patient being monitored Patient Re-evaluated:Patient Re-evaluated prior to induction Oxygen Delivery Method: Circle system utilized Preoxygenation: Pre-oxygenation with 100% oxygen Induction Type: IV induction Ventilation: Mask ventilation without difficulty LMA: LMA inserted LMA Size: 4.0 Number of attempts: 1 Placement Confirmation: positive ETCO2 and breath sounds checked- equal and bilateral Tube secured with: Tape Dental Injury: Teeth and Oropharynx as per pre-operative assessment

## 2021-04-26 NOTE — H&P (Signed)
66 y.o. G2P2 presents for hysteroscopy, D&C and LEEP for abnormal pap.  She presented as a new patient to me with a history of an atypical glandular cells favors neoplasia pap the year prior.   With her prior physician she had a colposcopy with a normal ECC.  EMB did not obtain any endometrial tissue.  She had an [place] with a thin endometrial stripe.   She had a repeat pap that was normal.    At her first visit, we discussed the recommended work up for [employer] neoplasia pap.  Although she has had a normal follow up pap since that result, work up was incomplete. No endometrial sampling has been obtained.  We discussed the option of expectant management, know that a pap smear is screening and could have been an overcall, but that conversely we may be missing a cancer. She wants to proceed with proper evaluation.  A repeat attempt at EMB was performed, but again no endometrial tissue was obtained.  In addition, per ASCCP guidelines, if ECC and EMB negative, should proceed with diagnostic excisional procedure--will plan to do concurrently in OR.       Past Medical History:  Diagnosis Date   Acute thoracic back pain 11/17/2020   Anterior epistaxis 02/21/2021   Anxiety 12/11/2016   Last Assessment & Plan:  Formatting of this note might be different from the original. Anxiety at this time is playing a significant role in her issues.  I have discussed with her that she should consider speaking to her primary care physician and possibly consider placement on something for her anxiety.   Atrial fibrillation ([place]) 05/2016   Body mass index (BMI) 26.0-26.9, adult 09/27/2020   Bradycardia 09/04/2016   Formatting of this note might be different from the original. A.  Exacerbated by beta-blocker.  Last Assessment & Plan:  Formatting of this note might be different from the original. Resting heart rate today is 47. I do suspect there is some degree of sick sinus syndrome.  She has noted a decrease in her exercise  capacity and would recommend a plain GXT to assess heart rate response to exercise an   Cervical spondylosis 11/18/2020   Degenerative spondylolisthesis 03/03/2019   Dysrhythmia    Elevated blood-pressure reading, without diagnosis of hypertension 12/03/2019   Encounter for screening colonoscopy 02/19/2019   Formatting of this note might be different from the original. 12/20 colon - no polyps Recommend screening exam in 12/30 - [facility]/GAP   Erysipelas 02/21/2021   Hyperlipidemia 2014   Lumbar radiculopathy 01/14/2019   Migraine 08/29/2016   On anticoagulant therapy 08/29/2016   Formatting of this note might be different from the original. A. CHADS2 score of 0. B. CHADS2VASC score of 1. C. Treated with Aspirin   Last Assessment & Plan:  Formatting of this note might be different from the original. Chads 2 vas score of 1.  She is currently treated with an aspirin.  I would recommend down titration to 162 mg daily.   Other fatigue 12/12/2017   Last Assessment & Plan:  Formatting of this note might be different from the original. Decreased exercise capacity.  Is unclear if this is due to chronotropic incompetence.  Will recommend GXT to assess heart rate response to exercise.   Pain in left hip    POSTERIOR AND LATERAL    Palpitations    Spinal stenosis in cervical region 09/27/2020   Spinal stenosis of lumbar region 03/24/2019   Spondylolisthesis, site unspecified 10/28/2019  Thoracic spondylosis 11/18/2020    Past Surgical History:  Procedure Laterality Date   BACK SURGERY  2002   [place]   FOOT SURGERY  2007   TONSILLECTOMY  1965    OB History  No obstetric history on file.    Social History   Socioeconomic History   Marital status: Married    Spouse name: Not on file   Number of children: Not on file   Years of education: Not on file   Highest education level: Not on file  Occupational History   Not on file  Tobacco Use   Smoking status: Never    Smokeless tobacco: Never  Vaping Use   Vaping Use: Never used  Substance and Sexual Activity   Alcohol use: Never   Drug use: Never   Sexual activity: Yes    Comment: MARRIED  Other Topics Concern   Not on file  Social History Narrative   Not on file   Social Determinants of Health   Financial Resource Strain: Not on file  Food Insecurity: Not on file  Transportation Needs: Not on file  Physical Activity: Not on file  Stress: Not on file  Social Connections: Not on file  Intimate Partner Violence: Not on file   Amoxicillin and Penicillins    Vitals:   04/26/21 1026  BP: (!) 155/85  Pulse: (!) 57  Resp: 16  Temp: 97.9 F (36.6 C)  SpO2: 100%     General:  NAD Abdomen:  soft Pelvic:  mild vaginal atrophy, grossly normal cervix, small anteverted utuers Ex:  no edema    A/P   66 y.o.  G2P2  presents for hysteroscopy, dilation and curettage and LEEP for AGC-favors neoplasia pap.   Discussed risks to include infection, bleeding, damage to surrounding structures (including but not limited to vagina, cervix, bladder, uterus), uterine perforation, inability to obtain sample, need for additional procedures.  She used misoprostol last night and again this morning.  Given inability to sample endometrium twice in office, plan hysteroscopy under US guidance today.  All questions answered and patient elects to proceed.   [place]

## 2021-04-26 NOTE — Op Note (Signed)
Operative Note  Pre-operative Diagnosis: Abnormal pap smear: atypical glandular cell, favors neoplasia  Post-operative Diagnosis: Abnormal pap smear: atypical glandular cell, favors neoplasia  Surgeon: Jerelyn Charles, MD  Procedure: hysteroscopy, dilation and curettage under ultrasound guidance, LEEP  Anesthesia: general  Estimated Blood Loss: minimal         Specimens: endometrial curettings, LEEP, post-LEEP endocervical curettage          Findings: Normal anteverted uterus. Thin, atrophic endometrium  Description of Procedure:         After adequate anesthesia was achieved, the patient placed in the dorsal lithotomy position in [place].  She was prepped and draped in the usual sterile fashion.   A bimanual exam revealed an anteverted uterus.  The bivalve speculum was placed in the vagina and the anterior lip of the cervix grasped with a single-tooth tenaculum.  20 mL of 1% lidocaine with epinephrine 1:200,000 was injected in a paracervical fashion.  Under ultrasound guidance, the cervix was serially dilated with Hank dilators to 60mm.  The hysteroscope was advanced under direct visualization.  The uterine cavity was surveyed and the endometrium was noted to be thin and atrophic.  Both tubal ostia were identified. The hysteroscope was removed and a sharp curettage was performed, again under ultrasound guidance.  The endometrial curettings were sent to pathology.  At this time, the tenaculum was removed.  The side opening speculum was replaced with an insulated speculum and the smoke evacuation tubing was attached. Lugol's solution was applied and diffuse uptake noted across the cervix and upper vagina.  The electrocautery was set to blend mode at 50 watts. A 20 x 13mm mm LEEP electrode was used to perform a single pass excision of the entire transformation zone. The specimen was tagged at 12 o'clock.  A post-LEEP ECC was performed. The base and the edges of the LEEP specimen were cauterized  with a 38mm ball electrode. Hemostasis was achieved.  All the vaginal instruments were removed.  Counts were correct.  The patient was awakened from anesthesia and transferred to PACU in stable condition.    [place], [facility]

## 2021-04-26 NOTE — Anesthesia Postprocedure Evaluation (Signed)
Anesthesia Post Note  Patient: Madeline Mcmillan  Procedure(s) Performed: DILATATION AND CURETTAGE /HYSTEROSCOPY LOOP ELECTROSURGICAL EXCISION PROCEDURE (LEEP) OPERATIVE ULTRASOUND GUIDANCE     Patient location during evaluation: PACU Anesthesia Type: General Level of consciousness: awake and alert, awake and oriented Pain management: pain level controlled Vital Signs Assessment: post-procedure vital signs reviewed and stable Respiratory status: spontaneous breathing, nonlabored ventilation and respiratory function stable Cardiovascular status: stable and blood pressure returned to baseline Postop Assessment: no apparent nausea or vomiting Anesthetic complications: no   No notable events documented.  Last Vitals:  Vitals:   04/26/21 1330 04/26/21 1403  BP: 120/76 129/77  Pulse: 66 71  Resp: 13 14  Temp: (!) 36.3 C (!) 36.4 C  SpO2: 100% 100%    Last Pain:  Vitals:   04/26/21 1403  TempSrc:   PainSc: 0-No pain                 Santa Lighter

## 2021-04-26 NOTE — Transfer of Care (Signed)
Immediate Anesthesia Transfer of Care Note  Patient: Madeline Mcmillan  Procedure(s) Performed: DILATATION AND CURETTAGE /HYSTEROSCOPY LOOP ELECTROSURGICAL EXCISION PROCEDURE (LEEP) OPERATIVE ULTRASOUND GUIDANCE  Patient Location: PACU  Anesthesia Type:General  Level of Consciousness: awake  Airway & Oxygen Therapy: Patient Spontanous Breathing and Patient connected to nasal cannula oxygen  Post-op Assessment: Report given to RN and Post -op Vital signs reviewed and stable  Post vital signs: Reviewed and stable  Last Vitals:  Vitals Value Taken Time  BP 116/71 04/26/21 1251  Temp    Pulse 99 04/26/21 1253  Resp 13 04/26/21 1253  SpO2 99 % 04/26/21 1253  Vitals shown include unvalidated device data.  Last Pain:  Vitals:   04/26/21 1026  TempSrc: Oral  PainSc: 3       Patients Stated Pain Goal: 7 (04/26/21 1026)  Complications: No notable events documented.

## 2021-04-27 ENCOUNTER — Encounter (HOSPITAL_BASED_OUTPATIENT_CLINIC_OR_DEPARTMENT_OTHER): Payer: Self-pay | Admitting: Obstetrics

## 2021-04-28 LAB — SURGICAL PATHOLOGY

## 2021-06-06 DIAGNOSIS — L820 Inflamed seborrheic keratosis: Secondary | ICD-10-CM | POA: Diagnosis not present

## 2021-06-06 DIAGNOSIS — L578 Other skin changes due to chronic exposure to nonionizing radiation: Secondary | ICD-10-CM | POA: Diagnosis not present

## 2021-06-06 DIAGNOSIS — D225 Melanocytic nevi of trunk: Secondary | ICD-10-CM | POA: Diagnosis not present

## 2021-06-06 DIAGNOSIS — L814 Other melanin hyperpigmentation: Secondary | ICD-10-CM | POA: Diagnosis not present

## 2021-06-13 DIAGNOSIS — Z6825 Body mass index (BMI) 25.0-25.9, adult: Secondary | ICD-10-CM | POA: Diagnosis not present

## 2021-06-13 DIAGNOSIS — Z13820 Encounter for screening for osteoporosis: Secondary | ICD-10-CM | POA: Diagnosis not present

## 2021-06-13 DIAGNOSIS — Z780 Asymptomatic menopausal state: Secondary | ICD-10-CM | POA: Diagnosis not present

## 2021-06-13 DIAGNOSIS — E785 Hyperlipidemia, unspecified: Secondary | ICD-10-CM | POA: Diagnosis not present

## 2021-06-13 DIAGNOSIS — I4891 Unspecified atrial fibrillation: Secondary | ICD-10-CM | POA: Diagnosis not present

## 2021-07-27 DIAGNOSIS — M5416 Radiculopathy, lumbar region: Secondary | ICD-10-CM | POA: Diagnosis not present

## 2021-07-27 DIAGNOSIS — Z6826 Body mass index (BMI) 26.0-26.9, adult: Secondary | ICD-10-CM | POA: Diagnosis not present

## 2021-08-21 DIAGNOSIS — M545 Low back pain, unspecified: Secondary | ICD-10-CM | POA: Diagnosis not present

## 2021-08-21 DIAGNOSIS — M4726 Other spondylosis with radiculopathy, lumbar region: Secondary | ICD-10-CM | POA: Diagnosis not present

## 2021-08-28 DIAGNOSIS — M4726 Other spondylosis with radiculopathy, lumbar region: Secondary | ICD-10-CM | POA: Diagnosis not present

## 2021-08-28 DIAGNOSIS — M545 Low back pain, unspecified: Secondary | ICD-10-CM | POA: Diagnosis not present

## 2021-09-07 DIAGNOSIS — Z6827 Body mass index (BMI) 27.0-27.9, adult: Secondary | ICD-10-CM | POA: Diagnosis not present

## 2021-09-07 DIAGNOSIS — M5416 Radiculopathy, lumbar region: Secondary | ICD-10-CM | POA: Diagnosis not present

## 2021-10-19 DIAGNOSIS — Z6827 Body mass index (BMI) 27.0-27.9, adult: Secondary | ICD-10-CM | POA: Diagnosis not present

## 2021-10-19 DIAGNOSIS — M5416 Radiculopathy, lumbar region: Secondary | ICD-10-CM | POA: Diagnosis not present

## 2021-10-30 ENCOUNTER — Other Ambulatory Visit: Payer: Self-pay | Admitting: Neurosurgery

## 2021-10-30 DIAGNOSIS — M5416 Radiculopathy, lumbar region: Secondary | ICD-10-CM

## 2021-11-14 ENCOUNTER — Ambulatory Visit
Admission: RE | Admit: 2021-11-14 | Discharge: 2021-11-14 | Disposition: A | Discharge status: Home / Self Care | Payer: BC Managed Care – PPO | Source: Ambulatory Visit | Attending: Neurosurgery | Admitting: Neurosurgery

## 2021-11-14 DIAGNOSIS — M4316 Spondylolisthesis, lumbar region: Secondary | ICD-10-CM | POA: Diagnosis not present

## 2021-11-14 DIAGNOSIS — M5416 Radiculopathy, lumbar region: Secondary | ICD-10-CM

## 2021-11-14 DIAGNOSIS — H16223 Keratoconjunctivitis sicca, not specified as Sjogren's, bilateral: Secondary | ICD-10-CM | POA: Diagnosis not present

## 2021-11-14 DIAGNOSIS — M5127 Other intervertebral disc displacement, lumbosacral region: Secondary | ICD-10-CM | POA: Diagnosis not present

## 2021-11-15 DIAGNOSIS — Z6826 Body mass index (BMI) 26.0-26.9, adult: Secondary | ICD-10-CM | POA: Diagnosis not present

## 2021-11-15 DIAGNOSIS — M4310 Spondylolisthesis, site unspecified: Secondary | ICD-10-CM | POA: Diagnosis not present

## 2021-12-06 DIAGNOSIS — R1011 Right upper quadrant pain: Secondary | ICD-10-CM | POA: Diagnosis not present

## 2022-01-03 DIAGNOSIS — L820 Inflamed seborrheic keratosis: Secondary | ICD-10-CM | POA: Diagnosis not present

## 2022-02-02 DIAGNOSIS — R109 Unspecified abdominal pain: Secondary | ICD-10-CM | POA: Diagnosis not present

## 2022-02-02 DIAGNOSIS — Z6826 Body mass index (BMI) 26.0-26.9, adult: Secondary | ICD-10-CM | POA: Diagnosis not present

## 2022-02-02 DIAGNOSIS — I4891 Unspecified atrial fibrillation: Secondary | ICD-10-CM | POA: Diagnosis not present

## 2022-02-02 DIAGNOSIS — Z2821 Immunization not carried out because of patient refusal: Secondary | ICD-10-CM | POA: Diagnosis not present

## 2022-03-06 DIAGNOSIS — Z8742 Personal history of other diseases of the female genital tract: Secondary | ICD-10-CM | POA: Diagnosis not present

## 2022-03-06 DIAGNOSIS — Z01419 Encounter for gynecological examination (general) (routine) without abnormal findings: Secondary | ICD-10-CM | POA: Diagnosis not present

## 2022-03-06 DIAGNOSIS — Z124 Encounter for screening for malignant neoplasm of cervix: Secondary | ICD-10-CM | POA: Diagnosis not present

## 2022-03-06 DIAGNOSIS — Z1231 Encounter for screening mammogram for malignant neoplasm of breast: Secondary | ICD-10-CM | POA: Diagnosis not present

## 2022-03-14 DIAGNOSIS — J40 Bronchitis, not specified as acute or chronic: Secondary | ICD-10-CM | POA: Diagnosis not present

## 2022-04-23 ENCOUNTER — Encounter: Payer: Self-pay | Admitting: Cardiology

## 2022-04-23 ENCOUNTER — Ambulatory Visit: Payer: BC Managed Care – PPO | Attending: Cardiology | Admitting: Cardiology

## 2022-04-23 VITALS — BP 118/78 | HR 59 | Ht 63.0 in | Wt 151.0 lb

## 2022-04-23 DIAGNOSIS — I480 Paroxysmal atrial fibrillation: Secondary | ICD-10-CM | POA: Diagnosis not present

## 2022-04-23 MED ORDER — DILTIAZEM HCL 30 MG PO TABS: 30.0000 mg | ORAL_TABLET | Freq: Four times a day (QID) | ORAL | 2 refills | Status: AC | PRN

## 2022-04-23 MED ORDER — FLECAINIDE ACETATE 100 MG PO TABS: 100.0000 mg | ORAL_TABLET | ORAL | 2 refills | Status: AC | PRN

## 2022-04-23 NOTE — Progress Notes (Addendum)
Electrophysiology Office Note   Date:  04/23/2022   ID:  Madeline Mcmillan, DOB [birth year], MRN [id number]  PCP:  Angelina Sheriff, MD  Cardiologist:   Primary Electrophysiologist:  Fransisco Messmer Meredith Leeds, MD    No chief complaint on file.     History of Present Illness: Madeline Mcmillan is a 67 y.o. female who is being seen today for the evaluation of atrial fibrillation at the request of Angelina Sheriff, MD. Presenting today for electrophysiology evaluation.  She has a history significant hyperlipidemia and atrial fibrillation. In February 2018 she was walking, shaky with shortness of breath.  Her symptoms persisted and she went to the local hospital and was diagnosed with atrial fibrillation.  She was started on a diltiazem and converted to sinus rhythm.  She had a normal stress test and a normal thyroid.  She is on as needed flecainide.    Today, denies symptoms of chest pain, shortness of breath, orthopnea, PND, lower extremity edema, claudication, dizziness, presyncope, syncope, bleeding, or neurologic sequela. The patient is tolerating medications without difficulties.  She is continue to have short palpitations.  Palpitations last a few minutes at a time and occur multiple times a week.  Despite this, she is quite happy with her control. She continues to not need her as needed medications.   Past Medical History:  Diagnosis Date   Acute thoracic back pain 11/17/2020   Anterior epistaxis 02/21/2021   Anxiety 12/11/2016   Last Assessment & Plan:  Formatting of this note might be different from the original. Anxiety at this time is playing a significant role in her issues.  I have discussed with her that she should consider speaking to her primary care physician and possibly consider placement on something for her anxiety.   Atrial fibrillation ([place]) 05/2016   Body mass index (BMI) 26.0-26.9, adult 09/27/2020   Bradycardia 09/04/2016   Formatting of this note might be  different from the original. A.  Exacerbated by beta-blocker.  Last Assessment & Plan:  Formatting of this note might be different from the original. Resting heart rate today is 47. I do suspect there is some degree of sick sinus syndrome.  She has noted a decrease in her exercise capacity and would recommend a plain GXT to assess heart rate response to exercise an   Cervical spondylosis 11/18/2020   Degenerative spondylolisthesis 03/03/2019   Dysrhythmia    Elevated blood-pressure reading, without diagnosis of hypertension 12/03/2019   Encounter for screening colonoscopy 02/19/2019   Formatting of this note might be different from the original. 12/20 colon - no polyps Recommend screening exam in 12/30 - [facility]/GAP   Erysipelas 02/21/2021   Hyperlipidemia 2014   Lumbar radiculopathy 01/14/2019   Migraine 08/29/2016   On anticoagulant therapy 08/29/2016   Formatting of this note might be different from the original. A. CHADS2 score of 0. B. CHADS2VASC score of 1. C. Treated with Aspirin   Last Assessment & Plan:  Formatting of this note might be different from the original. Chads 2 vas score of 1.  She is currently treated with an aspirin.  I would recommend down titration to 162 mg daily.   Other fatigue 12/12/2017   Last Assessment & Plan:  Formatting of this note might be different from the original. Decreased exercise capacity.  Is unclear if this is due to chronotropic incompetence.  P[place]h recommend GXT to assess heart rate response to exercise.   Pain in left hip  POSTERIOR AND LATERAL    Palpitations    Spinal stenosis in cervical region 09/27/2020   Spinal stenosis of lumbar region 03/24/2019   Spondylolisthesis, site unspecified 10/28/2019   Thoracic spondylosis 11/18/2020   Past Surgical History:  Procedure Laterality Date   BACK SURGERY  2002   [place]  2007   HYSTEROSCOPY WITH D & C N/A 04/26/2021   Procedure: DILATATION AND CURETTAGE  /HYSTEROSCOPY;  Surgeon: Jerelyn Charles, MD;  Location: [place];  Service: Gynecology;  Laterality: N/A;   LEEP N/A 04/26/2021   Procedure: LOOP ELECTROSURGICAL EXCISION PROCEDURE (LEEP);  Surgeon: Jerelyn Charles, MD;  Location: [facility];  Service: Gynecology;  Laterality: N/A;   OPERATIVE ULTRASOUND N/A 04/26/2021   Procedure: OPERATIVE ULTRASOUND GUIDANCE;  Surgeon: Jerelyn Charles, MD;  Location: [place];  Service: Gynecology;  Laterality: N/A;   TONSILLECTOMY  1965     Current Outpatient Medications  Medication Sig Dispense Refill   atorvastatin (LIPITOR) 10 MG tablet Take 10 mg by mouth at bedtime.      CALCIUM PO Take 1 capsule by mouth daily.     diltiazem (CARDIZEM) 30 MG tablet Take 1 tablet (30 mg total) by mouth 4 (four) times daily as needed (a fib). 15 tablet 2   flecainide (TAMBOCOR) 100 MG tablet Take 1 tablet (100 mg total) by mouth as needed (as directed). 15 tablet 2   No current facility-administered medications for this visit.    Allergies:   Amoxicillin and Penicillins   Social History:  The patient  reports that she has never smoked. She has never used smokeless tobacco. She reports that she does not drink alcohol and does not use drugs.   Family History:  The patient's family history includes CAD in her mother; Cerebral aneurysm in her brother; Healthy in her sister; Other in her father.   ROS:  Please see the history of present illness.   Otherwise, review of systems is positive for none.   All other systems are reviewed and negative.   PHYSICAL EXAM: VS:  BP 118/78   Pulse (!) 59   Ht 5\' 3"  (1.6 m)   Wt 151 lb (68.5 kg)   SpO2 99%   BMI 26.75 kg/m  , BMI Body mass index is 26.75 kg/m. GEN: Well nourished, well developed, in no acute distress  HEENT: normal  Neck: no JVD, carotid bruits, or masses Cardiac: RRR; no murmurs, rubs, or gallops,no edema  Respiratory:  clear to auscultation bilaterally, normal  work of breathing GI: soft, nontender, nondistended, + BS MS: no deformity or atrophy  Skin: warm and dry Neuro:  Strength and sensation are intact Psych: euthymic mood, full affect  EKG:  EKG is ordered today. Personal review of the ekg ordered shows sinus rhythm  Recent Labs: No results found for requested labs within last 365 days.    Lipid Panel  No results found for: "CHOL", "TRIG", "HDL", "CHOLHDL", "VLDL", "LDLCALC", "LDLDIRECT"   Wt Readings from Last 3 Encounters:  04/23/22 151 lb (68.5 kg)  04/26/21 148 lb 11.2 oz (67.4 kg)  02/22/21 148 lb 12.8 oz (67.5 kg)      Other studies Reviewed: Additional studies/ records that were reviewed today include: ETT 12/25/17  Review of the above records today demonstrates:  BASELINE EKG NORMAL AT 74/MIN.   PT EXERCISED 7 MIN, BRUCE AT 8 METS, ABOVE AVERAGE FUNCTIONAL  CAPACITY FOR AGE. ACHIEVED HR 136/MIN 86%  MPHR. NO ANGINA OR ST ABNORMALITY RARE PAC/PVC.  TTE 08/03/2019  1. Left ventricular ejection fraction, by estimation, is 60 to 65%. The  left ventricle has normal function. The left ventricle has no regional  wall motion abnormalities. Left ventricular diastolic parameters are  consistent with Grade I diastolic  dysfunction (impaired relaxation).   2. Right ventricular systolic function is normal. The right ventricular  size is normal. There is normal pulmonary artery systolic pressure.   3. The mitral valve is normal in structure. No evidence of mitral valve  regurgitation. No evidence of mitral stenosis.   ASSESSMENT AND PLAN:  1.  Paroxysmal atrial fibrillation: Diagnosed with findings.  CHA2DS2-VASc of 1.  Currently on flecainide 100 mg as needed, diltiazem as needed.  Needs to have a short episode of palpitations that lasted for 5 minutes.  This happens multiple times a month.  That said, she is happy with her control and does not want to change her medical management.  I was chaperoned during this clinic visit by  Trinidad Curet, Myrtie Hawk, Geoffry Paradise.  Current medicines are reviewed at length with the patient today.   The patient does not have concerns regarding her medicines.  The following changes were made today: None mildly closely  Labs/ tests ordered today include:  Orders Placed This Encounter  Procedures   EKG 12-Lead      Disposition:   FU 12 months  Signed, Newel Oien Meredith Leeds, MD  04/23/2022 9:31 AM     [facility] [address] [place] [address] [phone] (office) [phone] (fax)

## 2022-04-23 NOTE — Patient Instructions (Signed)
Medication Instructions:  °Your physician recommends that you continue on your current medications as directed. Please refer to the Current Medication list given to you today. ° °*If you need a refill on your cardiac medications before your next appointment, please call your pharmacy* ° ° °Lab Work: °None ordered ° ° °Testing/Procedures: °None ordered ° ° °Follow-Up: °At [facility], you and your health needs are our priority.  As part of our continuing mission to provide you with exceptional heart care, we have created designated Provider Care Teams.  These Care Teams include your primary Cardiologist (physician) and Advanced Practice Providers (APPs -  Physician Assistants and Nurse Practitioners) who all work together to provide you with the care you need, when you need it. ° °Your next appointment:   °1 year(s) ° °The format for your next appointment:   °In Person ° °Provider:   °Will Camnitz, MD ° ° ° °Thank you for choosing [facility]!! ° ° °Bobbyjo Marulanda, RN °[phone] ° ° ° °

## 2022-04-30 ENCOUNTER — Ambulatory Visit: Payer: BC Managed Care – PPO | Admitting: Podiatry

## 2022-04-30 VITALS — BP 133/83 | HR 62

## 2022-04-30 DIAGNOSIS — B351 Tinea unguium: Secondary | ICD-10-CM

## 2022-04-30 DIAGNOSIS — L603 Nail dystrophy: Secondary | ICD-10-CM | POA: Diagnosis not present

## 2022-04-30 DIAGNOSIS — L600 Ingrowing nail: Secondary | ICD-10-CM | POA: Diagnosis not present

## 2022-04-30 NOTE — Progress Notes (Signed)
Subjective:  Patient ID: Madeline Mcmillan, female    DOB: September 07, 1955,  MRN: [id number]  Chief Complaint  Patient presents with   Nail Problem    "I have a problem with thick toenails and one may be ingrown." N - thick ingrown toenail  L - hallux rt D - 1 month ingrown, years for thick nails O - gradually worse over time C - sore A - none T - Dr. Cannon Kettle told me to soak one to two times a week and use tetri oil    67 y.o. female presents with pain at the bilateral border of the right hallux nail.  She says that it is causing pain with pressure on the area on both sides of the right great toe.  Says it has been over a month ingrown gradually gotten worse.  She has been trying soaks for it but is not helping.  Past Medical History:  Diagnosis Date   Acute thoracic back pain 11/17/2020   Anterior epistaxis 02/21/2021   Anxiety 12/11/2016   Last Assessment & Plan:  Formatting of this note might be different from the original. Anxiety at this time is playing a significant role in her issues.  I have discussed with her that she should consider speaking to her primary care physician and possibly consider placement on something for her anxiety.   Atrial fibrillation ([place]) 05/2016   Body mass index (BMI) 26.0-26.9, adult 09/27/2020   Bradycardia 09/04/2016   Formatting of this note might be different from the original. A.  Exacerbated by beta-blocker.  Last Assessment & Plan:  Formatting of this note might be different from the original. Resting heart rate today is 47. I do suspect there is some degree of sick sinus syndrome.  She has noted a decrease in her exercise capacity and would recommend a plain GXT to assess heart rate response to exercise an   Cervical spondylosis 11/18/2020   Degenerative spondylolisthesis 03/03/2019   Dysrhythmia    Elevated blood-pressure reading, without diagnosis of hypertension 12/03/2019   Encounter for screening colonoscopy 02/19/2019   Formatting of this  note might be different from the original. 12/20 colon - no polyps Recommend screening exam in 12/30 - [facility]   Erysipelas 02/21/2021   Hyperlipidemia 2014   Lumbar radiculopathy 01/14/2019   Migraine 08/29/2016   On anticoagulant therapy 08/29/2016   Formatting of this note might be different from the original. A. CHADS2 score of 0. B. CHADS2VASC score of 1. C. Treated with Aspirin   Last Assessment & Plan:  Formatting of this note might be different from the original. Chads 2 vas score of 1.  She is currently treated with an aspirin.  I would recommend down titration to 162 mg daily.   Other fatigue 12/12/2017   Last Assessment & Plan:  Formatting of this note might be different from the original. Decreased exercise capacity.  Is unclear if this is due to chronotropic incompetence.  Will recommend GXT to assess heart rate response to exercise.   Pain in left hip    POSTERIOR AND LATERAL    Palpitations    Spinal stenosis in cervical region 09/27/2020   Spinal stenosis of lumbar region 03/24/2019   Spondylolisthesis, site unspecified 10/28/2019   Thoracic spondylosis 11/18/2020    Allergies  Allergen Reactions   Amoxicillin Rash   Penicillins Rash    Did it involve swelling of the face/tongue/throat, SOB, or low BP? No Did it involve sudden or severe rash/hives, skin peeling, or  any reaction on the inside of your mouth or nose? No Did you need to seek medical attention at a hospital or doctor's office? No When did it last happen?      15 years If all above answers are "NO", may proceed with cephalosporin use.    ROS: Negative except as per HPI above  Objective:  General: AAO x3, NAD  Dermatological: Incurvation is present along the bilateral nail border of the right great toe. There is localized edema without any erythema or increase in warmth around the nail border. There is no drainage or pus. There is no ascending cellulitis. No malodor. No open lesions or pre-ulcerative  lesions.    Vascular:  Dorsalis Pedis artery and Posterior Tibial artery pedal pulses are 2/4 bilateral.  Capillary fill time < 3 sec to all digits.   Neruologic: Grossly intact via light touch bilateral. Protective threshold intact to all sites bilateral.   Musculoskeletal: No gross boney pedal deformities bilateral. No pain, crepitus, or limitation noted with foot and ankle range of motion bilateral. Muscular strength 5/5 in all groups tested bilateral.  Gait: Unassisted, Nonantalgic.   No images are attached to the encounter.   Assessment:   1. Ingrown nail of great toe of right foot   2. Onychomycosis   3. Nail dystrophy      Plan:  Patient was evaluated and treated and all questions answered.    Ingrown Nail, right -Patient elects to proceed with minor surgery to remove ingrown toenail today. Consent reviewed and signed by patient. -Ingrown nail excised. See procedure note. -Educated on post-procedure care including soaking. Written instructions provided and reviewed. -Patient to follow up in 2 weeks for nail check.  Procedure: Excision of Ingrown Toenail Location: Right 1st toe  bilateral  nail borders. Anesthesia: Lidocaine 1% plain; 1.5 mL and Marcaine 0.5% plain; 1.5 mL, digital block. Skin Prep: Betadine. Dressing: Silvadene; telfa; dry, sterile, compression dressing. Technique: Following skin prep, the toe was exsanguinated and a tourniquet was secured at the base of the toe. The affected nail border was freed, split with a nail splitter, and excised. Chemical matrixectomy was then performed with phenol and irrigated out with alcohol. The tourniquet was then removed and sterile dressing applied. Disposition: Patient tolerated procedure well. Patient to return in 2 weeks for follow-up.    Return in about 2 weeks (around 05/14/2022) for F/u R hallux bilateral border ingrown.          Everitt Amber, DPM [facility]

## 2022-04-30 NOTE — Patient Instructions (Signed)

## 2022-05-14 ENCOUNTER — Ambulatory Visit: Payer: BC Managed Care – PPO | Admitting: Podiatry

## 2022-05-14 DIAGNOSIS — B351 Tinea unguium: Secondary | ICD-10-CM

## 2022-05-14 DIAGNOSIS — L603 Nail dystrophy: Secondary | ICD-10-CM | POA: Diagnosis not present

## 2022-05-14 DIAGNOSIS — L600 Ingrowing nail: Secondary | ICD-10-CM

## 2022-05-14 NOTE — Progress Notes (Signed)
Subjective: Madeline Mcmillan is a 67 y.o.  female returns to office today for follow up evaluation after having right Hallux bilateral border nail ingrown removal with phenol and alcohol matrixectomy approximately 2 weeks ago. Patient has been soaking using epsom salts and applying topical antibiotic covered with bandaid daily. Patient denies fevers, chills, nausea, vomiting. Denies any calf pain, chest pain, SOB.   Objective:  Vitals: Reviewed  General: Well developed, nourished, in no acute distress, alert and oriented x3   Dermatology: Skin is warm, dry and supple bilateral. Right bilateral hallux nail border appears to be clean, dry, with mild granular tissue and surrounding scab. There is no surrounding erythema, edema, drainage/purulence. The remaining nails appear unremarkable at this time. There are no other lesions or other signs of infection present.  Neurovascular status: Intact. No lower extremity swelling; No pain with calf compression bilateral.  Musculoskeletal: Decreased tenderness to palpation of the right hallux nail fold(s). Muscular strength within normal limits bilateral.   Assesement and Plan: S/p phenol and alcohol matrixectomy to the  right hallux nail bilateral, doing well.   -Continue soaking in epsom salts twice a day followed by antibiotic ointment and a band-aid. Can leave uncovered at night. Continue this until completely healed.  -If the area has not healed in 2 weeks, call the office for follow-up appointment, or sooner if any problems arise.  -Monitor for any signs/symptoms of infection. Call the office immediately if any occur or go directly to the emergency room. Call with any questions/concerns.        Everitt Amber, [place] [facility]                   05/14/2022

## 2022-06-11 DIAGNOSIS — I4891 Unspecified atrial fibrillation: Secondary | ICD-10-CM | POA: Diagnosis not present

## 2022-06-11 DIAGNOSIS — Z6826 Body mass index (BMI) 26.0-26.9, adult: Secondary | ICD-10-CM | POA: Diagnosis not present

## 2022-06-11 DIAGNOSIS — R911 Solitary pulmonary nodule: Secondary | ICD-10-CM | POA: Diagnosis not present

## 2022-06-11 DIAGNOSIS — E785 Hyperlipidemia, unspecified: Secondary | ICD-10-CM | POA: Diagnosis not present

## 2022-06-25 DIAGNOSIS — R918 Other nonspecific abnormal finding of lung field: Secondary | ICD-10-CM | POA: Diagnosis not present

## 2022-06-25 DIAGNOSIS — R911 Solitary pulmonary nodule: Secondary | ICD-10-CM | POA: Diagnosis not present

## 2022-07-06 DIAGNOSIS — R7989 Other specified abnormal findings of blood chemistry: Secondary | ICD-10-CM | POA: Diagnosis not present

## 2022-07-06 DIAGNOSIS — R1011 Right upper quadrant pain: Secondary | ICD-10-CM | POA: Diagnosis not present

## 2022-07-10 DIAGNOSIS — R7989 Other specified abnormal findings of blood chemistry: Secondary | ICD-10-CM | POA: Diagnosis not present

## 2022-07-10 DIAGNOSIS — R1011 Right upper quadrant pain: Secondary | ICD-10-CM | POA: Diagnosis not present

## 2022-07-17 DIAGNOSIS — N281 Cyst of kidney, acquired: Secondary | ICD-10-CM | POA: Diagnosis not present

## 2022-07-17 DIAGNOSIS — N200 Calculus of kidney: Secondary | ICD-10-CM | POA: Diagnosis not present

## 2022-10-12 DIAGNOSIS — L814 Other melanin hyperpigmentation: Secondary | ICD-10-CM | POA: Diagnosis not present

## 2022-10-12 DIAGNOSIS — L821 Other seborrheic keratosis: Secondary | ICD-10-CM | POA: Diagnosis not present

## 2022-10-12 DIAGNOSIS — L570 Actinic keratosis: Secondary | ICD-10-CM | POA: Diagnosis not present

## 2022-11-27 DIAGNOSIS — Z1331 Encounter for screening for depression: Secondary | ICD-10-CM | POA: Diagnosis not present

## 2022-11-27 DIAGNOSIS — M8580 Other specified disorders of bone density and structure, unspecified site: Secondary | ICD-10-CM | POA: Diagnosis not present

## 2022-11-27 DIAGNOSIS — E785 Hyperlipidemia, unspecified: Secondary | ICD-10-CM | POA: Diagnosis not present

## 2022-11-27 DIAGNOSIS — Z1339 Encounter for screening examination for other mental health and behavioral disorders: Secondary | ICD-10-CM | POA: Diagnosis not present

## 2022-11-27 DIAGNOSIS — Z2821 Immunization not carried out because of patient refusal: Secondary | ICD-10-CM | POA: Diagnosis not present

## 2022-11-27 DIAGNOSIS — Z9181 History of falling: Secondary | ICD-10-CM | POA: Diagnosis not present

## 2022-11-27 DIAGNOSIS — Z79899 Other long term (current) drug therapy: Secondary | ICD-10-CM | POA: Diagnosis not present

## 2023-04-15 ENCOUNTER — Ambulatory Visit: Payer: PPO | Attending: Cardiology | Admitting: Cardiology

## 2023-04-15 ENCOUNTER — Encounter: Payer: Self-pay | Admitting: Cardiology

## 2023-04-15 VITALS — BP 104/84 | HR 65 | Ht 63.0 in | Wt 156.8 lb

## 2023-04-15 DIAGNOSIS — I480 Paroxysmal atrial fibrillation: Secondary | ICD-10-CM | POA: Diagnosis not present

## 2023-04-15 NOTE — Patient Instructions (Signed)
Medication Instructions:  Your physician recommends that you continue on your current medications as directed. Please refer to the Current Medication list given to you today.  *If you need a refill on your cardiac medications before your next appointment, please call your pharmacy*   Lab Work: None ordered   Testing/Procedures: None ordered   Follow-Up: At [facility], you and your health needs are our priority.  As part of our continuing mission to provide you with exceptional heart care, we have created designated Provider Care Teams.  These Care Teams include your primary Cardiologist (physician) and Advanced Practice Providers (APPs -  Physician Assistants and Nurse Practitioners) who all work together to provide you with the care you need, when you need it.  Your next appointment:   1 year(s)  The format for your next appointment:   In Person  Provider:   Loman Brooklyn, MD    Thank you for choosing [facility]!!   Dory Horn, RN [phone]

## 2023-04-15 NOTE — Progress Notes (Signed)
  Electrophysiology Office Note:   Date:  04/15/2023  ID:  Madeline Mcmillan, DOB 27-Feb-1956, MRN [id number]  Primary Cardiologist: None Primary Heart Failure: None Electrophysiologist: Deontra Pereyra Jorja Loa, MD      History of Present Illness:   Madeline Mcmillan is a 68 y.o. female with h/o hyperlipidemia, atrial fibrillation seen today for routine electrophysiology followup.   Since last being seen in our clinic the patient reports doing overall well.  She has no chest pain or shortness of breath.  She is able to do all of her daily activities without restriction.  She does have intermittent palpitations, but she does not feel that she has had much atrial fibrillation.  she denies chest pain, palpitations, dyspnea, PND, orthopnea, nausea, vomiting, dizziness, syncope, edema, weight gain, or early satiety.   Review of systems complete and found to be negative unless listed in HPI.   EP Information / Studies Reviewed:    EKG is ordered today. Personal review as below.  EKG Interpretation Date/Time:  Monday April 15 2023 13:47:23 EST Ventricular Rate:  65 PR Interval:  172 QRS Duration:  84 QT Interval:  424 QTC Calculation: 440 R Axis:   -3  Text Interpretation: Normal sinus rhythm Normal ECG When compared with ECG of 24-Sep-2019 10:44, No significant change was found Confirmed by Sundus Pete ([id number]) on 04/15/2023 1:52:26 PM     Risk Assessment/Calculations:    CHA2DS2-VASc Score = 2   This indicates a 2.2% annual risk of stroke. The patient's score is based upon: CHF History: 0 HTN History: 0 Diabetes History: 0 Stroke History: 0 Vascular Disease History: 0 Age Score: 1 Gender Score: 1             Physical Exam:   VS:  BP 104/84   Pulse 65   Ht 5\' 3"  (1.6 m)   Wt 156 lb 12.8 oz (71.1 kg)   SpO2 98%   BMI 27.78 kg/m    Wt Readings from Last 3 Encounters:  04/15/23 156 lb 12.8 oz (71.1 kg)  04/23/22 151 lb (68.5 kg)  04/26/21 148 lb 11.2 oz (67.4 kg)      GEN: Well nourished, well developed in no acute distress NECK: No JVD; No carotid bruits CARDIAC: Regular rate and rhythm, no murmurs, rubs, gallops RESPIRATORY:  Clear to auscultation without rales, wheezing or rhonchi  ABDOMEN: Soft, non-tender, non-distended EXTREMITIES:  No edema; No deformity   ASSESSMENT AND PLAN:    1.  Paroxysmal atrial fibrillation: Currently on diltiazem and flecainide.  She takes these as needed.  She is otherwise without complaint.  She does have intermittent palpitations, but is happy with her control.  We discussed the possibility of ablation but she is not ready for that at this time.  Follow up with Dr. Elberta Fortis in 12 months  Signed, Ltanya Bayley Jorja Loa, MD

## 2023-05-22 ENCOUNTER — Ambulatory Visit: Admitting: Podiatry

## 2023-05-22 DIAGNOSIS — L603 Nail dystrophy: Secondary | ICD-10-CM | POA: Diagnosis not present

## 2023-05-22 DIAGNOSIS — M76822 Posterior tibial tendinitis, left leg: Secondary | ICD-10-CM | POA: Diagnosis not present

## 2023-05-22 NOTE — Progress Notes (Signed)
 Chief Complaint  Patient presents with   Foot Pain    Right great toe, had an ingrown removed, but is now worried about how it looks. Left foot  at the arch stings. This does not happen all the time and it does not hurt to walk. Not diabetic no anti coags.     HPI: 68 y.o. female presents today for concern of a thickened right great toenail.  She had a ingrown toenail procedure performed by Dr. Annamary Rummage and states that the nail is resultantly thick.  She was wondering if there is anything that can help with the thickness and appearance of the nail.  There is some discomfort with pressure from shoes.  She has a secondary concern of pain along the left medial midfoot area.  Denies injury.  Denies bruising.  Notes some burning along her instep.  Past Medical History:  Diagnosis Date   Acute thoracic back pain 11/17/2020   Anterior epistaxis 02/21/2021   Anxiety 12/11/2016   Last Assessment & Plan:  Formatting of this note might be different from the original. Anxiety at this time is playing a significant role in her issues.  I have discussed with her that she should consider speaking to her primary care physician and possibly consider placement on something for her anxiety.   Atrial fibrillation ([facility]) 05/2016   Body mass index (BMI) 26.0-26.9, adult 09/27/2020   Bradycardia 09/04/2016   Formatting of this note might be different from the original. A.  Exacerbated by beta-blocker.  Last Assessment & Plan:  Formatting of this note might be different from the original. Resting heart rate today is 47. I do suspect there is some degree of sick sinus syndrome.  She has noted a decrease in her exercise capacity and would recommend a plain GXT to assess heart rate response to exercise an   Cervical spondylosis 11/18/2020   Degenerative spondylolisthesis 03/03/2019   Dysrhythmia    Elevated blood-pressure reading, without diagnosis of hypertension 12/03/2019   Encounter for screening colonoscopy  02/19/2019   Formatting of this note might be different from the original. 12/20 colon - no polyps Recommend screening exam in 12/30 - [facility]   Erysipelas 02/21/2021   Hyperlipidemia 2014   Lumbar radiculopathy 01/14/2019   Migraine 08/29/2016   On anticoagulant therapy 08/29/2016   Formatting of this note might be different from the original. A. CHADS2 score of 0. B. CHADS2VASC score of 1. C. Treated with Aspirin   Last Assessment & Plan:  Formatting of this note might be different from the original. Chads 2 vas score of 1.  She is currently treated with an aspirin.  I would recommend down titration to 162 mg daily.   Other fatigue 12/12/2017   Last Assessment & Plan:  Formatting of this note might be different from the original. Decreased exercise capacity.  Is unclear if this is due to chronotropic incompetence.  Will recommend GXT to assess heart rate response to exercise.   Pain in left hip    POSTERIOR AND LATERAL    Palpitations    Spinal stenosis in cervical region 09/27/2020   Spinal stenosis of lumbar region 03/24/2019   Spondylolisthesis, site unspecified 10/28/2019   Thoracic spondylosis 11/18/2020    Past Surgical History:  Procedure Laterality Date   BACK SURGERY  2002   CESAREAN SECTION  1989, 1991   FOOT SURGERY  2007   HYSTEROSCOPY WITH D & C N/A 04/26/2021   Procedure: DILATATION AND CURETTAGE /HYSTEROSCOPY;  Surgeon: Marlow Baars, MD;  Location: [facility];  Service: Gynecology;  Laterality: N/A;   LEEP N/A 04/26/2021   Procedure: LOOP ELECTROSURGICAL EXCISION PROCEDURE (LEEP);  Surgeon: Marlow Baars, MD;  Location: [facility];  Service: Gynecology;  Laterality: N/A;   OPERATIVE ULTRASOUND N/A 04/26/2021   Procedure: OPERATIVE ULTRASOUND GUIDANCE;  Surgeon: Marlow Baars, MD;  Location[facility];  Service: Gynecology;  Laterality: N/A;   TONSILLECTOMY  1965    Allergies  Allergen Reactions   Amoxicillin Rash    Penicillins Rash    Did it involve swelling of the face/tongue/throat, SOB, or low BP? No Did it involve sudden or severe rash/hives, skin peeling, or any reaction on the inside of your mouth or nose? No Did you need to seek medical attention at a hospital or doctor's office? No When did it last happen?      15 years If all above answers are "NO", may proceed with cephalosporin use.     Physical Exam: There are palpable pedal pulses.  The right hallux nail is dystrophic and 3 mm thick with slight yellow discoloration.  No distal onycholysis is noted.  There is pain on palpation to the distal portion of the left posterior tibial tendon near its insertion into the navicular.  The pain is aggravated with resisted supination of the foot.  No palpable gaps or nodules are noted within the posterior tibial tendon.  Manual muscle testing 5/5.  Epicritic sensation is intact  Assessment/Plan of Care: 1. Posterior tibial tendinitis, left   2. Dystrophic nail     FOR HOME USE ONLY DME POWER STEP INSERTS  Discussed clinical findings with patient today.  Recommended power step inserts for additional arch support for the posterior tibial tendinitis.  Performed a cortisone injection to the left posterior tibial tendon near its insertion.  This consisted of a mixture of 1% lidocaine plain, 0.5 sent Marcaine plain and Kenalog 10 for total of 1.5 cc administered.  A Band-Aid was applied.  She can remove the Band-Aid at bedtime.  Recommended urea nail gel 47% to apply to the right hallux nail once daily.  This needs to be used for at least 9 to 12 months consistently.  Follow-up as needed  Clerance Lav, DPM, FACFAS [facility]     [address] [place], [place] [address]                Office [phone]  Fax [phone]

## 2023-07-24 DIAGNOSIS — M79671 Pain in right foot: Secondary | ICD-10-CM | POA: Diagnosis not present

## 2023-10-01 DIAGNOSIS — R202 Paresthesia of skin: Secondary | ICD-10-CM | POA: Diagnosis not present

## 2023-10-01 DIAGNOSIS — M79671 Pain in right foot: Secondary | ICD-10-CM | POA: Diagnosis not present

## 2023-10-01 DIAGNOSIS — Z6827 Body mass index (BMI) 27.0-27.9, adult: Secondary | ICD-10-CM | POA: Diagnosis not present

## 2023-10-10 DIAGNOSIS — Z6827 Body mass index (BMI) 27.0-27.9, adult: Secondary | ICD-10-CM | POA: Diagnosis not present

## 2023-10-10 DIAGNOSIS — F411 Generalized anxiety disorder: Secondary | ICD-10-CM | POA: Diagnosis not present

## 2023-10-28 DIAGNOSIS — L821 Other seborrheic keratosis: Secondary | ICD-10-CM | POA: Diagnosis not present

## 2023-10-28 DIAGNOSIS — I788 Other diseases of capillaries: Secondary | ICD-10-CM | POA: Diagnosis not present

## 2023-10-28 DIAGNOSIS — L814 Other melanin hyperpigmentation: Secondary | ICD-10-CM | POA: Diagnosis not present

## 2023-10-28 DIAGNOSIS — L820 Inflamed seborrheic keratosis: Secondary | ICD-10-CM | POA: Diagnosis not present

## 2023-11-12 DIAGNOSIS — R202 Paresthesia of skin: Secondary | ICD-10-CM | POA: Diagnosis not present

## 2023-11-12 DIAGNOSIS — R200 Anesthesia of skin: Secondary | ICD-10-CM | POA: Diagnosis not present

## 2023-11-12 DIAGNOSIS — Z1330 Encounter for screening examination for mental health and behavioral disorders, unspecified: Secondary | ICD-10-CM | POA: Diagnosis not present

## 2024-01-06 DIAGNOSIS — R202 Paresthesia of skin: Secondary | ICD-10-CM | POA: Diagnosis not present

## 2024-01-06 DIAGNOSIS — R200 Anesthesia of skin: Secondary | ICD-10-CM | POA: Diagnosis not present

## 2024-01-07 DIAGNOSIS — R202 Paresthesia of skin: Secondary | ICD-10-CM | POA: Diagnosis not present

## 2024-01-07 DIAGNOSIS — I480 Paroxysmal atrial fibrillation: Secondary | ICD-10-CM | POA: Diagnosis not present

## 2024-01-07 DIAGNOSIS — R200 Anesthesia of skin: Secondary | ICD-10-CM | POA: Diagnosis not present

## 2024-01-08 DIAGNOSIS — H43811 Vitreous degeneration, right eye: Secondary | ICD-10-CM | POA: Diagnosis not present

## 2024-01-08 DIAGNOSIS — H2513 Age-related nuclear cataract, bilateral: Secondary | ICD-10-CM | POA: Diagnosis not present

## 2024-01-15 DIAGNOSIS — L820 Inflamed seborrheic keratosis: Secondary | ICD-10-CM | POA: Diagnosis not present

## 2024-01-15 DIAGNOSIS — L821 Other seborrheic keratosis: Secondary | ICD-10-CM | POA: Diagnosis not present

## 2024-01-15 DIAGNOSIS — D1801 Hemangioma of skin and subcutaneous tissue: Secondary | ICD-10-CM | POA: Diagnosis not present

## 2024-01-15 DIAGNOSIS — D485 Neoplasm of uncertain behavior of skin: Secondary | ICD-10-CM | POA: Diagnosis not present

## 2024-02-03 DIAGNOSIS — R202 Paresthesia of skin: Secondary | ICD-10-CM | POA: Diagnosis not present

## 2024-02-03 DIAGNOSIS — E538 Deficiency of other specified B group vitamins: Secondary | ICD-10-CM | POA: Diagnosis not present

## 2024-04-20 ENCOUNTER — Ambulatory Visit: Admitting: Cardiology

## 2024-06-29 ENCOUNTER — Ambulatory Visit: Admitting: Cardiology
